# Patient Record
Sex: Male | Born: 1950 | Race: White | Hispanic: No | Marital: Married | Smoking: Never smoker
Health system: Southern US, Community
[De-identification: ages and names within clinical notes are randomized; demographics above are authoritative.]

## PROBLEM LIST (undated history)

## (undated) DIAGNOSIS — K649 Unspecified hemorrhoids: Secondary | ICD-10-CM

## (undated) DIAGNOSIS — F329 Major depressive disorder, single episode, unspecified: Secondary | ICD-10-CM

## (undated) DIAGNOSIS — E785 Hyperlipidemia, unspecified: Secondary | ICD-10-CM

## (undated) DIAGNOSIS — F32A Depression, unspecified: Secondary | ICD-10-CM

## (undated) HISTORY — PX: HERNIA REPAIR: SHX51

---

## 1999-05-22 ENCOUNTER — Ambulatory Visit (HOSPITAL_BASED_OUTPATIENT_CLINIC_OR_DEPARTMENT_OTHER): Admission: RE | Admit: 1999-05-22 | Discharge: 1999-05-22 | Payer: Self-pay

## 2000-02-05 ENCOUNTER — Ambulatory Visit (HOSPITAL_COMMUNITY): Admission: RE | Admit: 2000-02-05 | Discharge: 2000-02-05 | Payer: Self-pay | Admitting: Gastroenterology

## 2012-09-16 ENCOUNTER — Ambulatory Visit (HOSPITAL_COMMUNITY): Payer: Self-pay | Admitting: Licensed Clinical Social Worker

## 2015-03-17 ENCOUNTER — Other Ambulatory Visit: Payer: Self-pay | Admitting: Gastroenterology

## 2015-03-17 DIAGNOSIS — R079 Chest pain, unspecified: Secondary | ICD-10-CM

## 2015-03-23 ENCOUNTER — Ambulatory Visit
Admission: RE | Admit: 2015-03-23 | Discharge: 2015-03-23 | Disposition: A | Discharge status: Home / Self Care | Payer: Medicare Other | Source: Ambulatory Visit | Attending: Gastroenterology | Admitting: Gastroenterology

## 2015-03-23 DIAGNOSIS — R079 Chest pain, unspecified: Secondary | ICD-10-CM

## 2017-02-16 ENCOUNTER — Emergency Department (HOSPITAL_COMMUNITY)
Admission: EM | Admit: 2017-02-16 | Discharge: 2017-02-16 | Disposition: A | Discharge status: Home / Self Care | Payer: Medicare Other | Attending: Emergency Medicine | Admitting: Emergency Medicine

## 2017-02-16 ENCOUNTER — Encounter (HOSPITAL_COMMUNITY): Payer: Self-pay | Admitting: Nurse Practitioner

## 2017-02-16 DIAGNOSIS — K648 Other hemorrhoids: Secondary | ICD-10-CM | POA: Insufficient documentation

## 2017-02-16 DIAGNOSIS — K645 Perianal venous thrombosis: Secondary | ICD-10-CM

## 2017-02-16 HISTORY — DX: Major depressive disorder, single episode, unspecified: F32.9

## 2017-02-16 HISTORY — DX: Depression, unspecified: F32.A

## 2017-02-16 HISTORY — DX: Hyperlipidemia, unspecified: E78.5

## 2017-02-16 HISTORY — DX: Unspecified hemorrhoids: K64.9

## 2017-02-16 MED ORDER — LIDOCAINE-EPINEPHRINE (PF) 2 %-1:200000 IJ SOLN
10.0000 mL | Freq: Once | INTRAMUSCULAR | Status: AC
  Administered 2017-02-16: 10 mL
  Filled 2017-02-16: qty 20

## 2017-02-16 MED ORDER — SENNOSIDES-DOCUSATE SODIUM 8.6-50 MG PO TABS: 1.0000 | ORAL_TABLET | Freq: Every evening | ORAL | 0 refills | Status: DC | PRN

## 2017-02-16 MED ORDER — OXYCODONE-ACETAMINOPHEN 5-325 MG PO TABS: 2.0000 | ORAL_TABLET | ORAL | 0 refills | Status: DC | PRN

## 2017-02-16 NOTE — ED Provider Notes (Signed)
Emergency Department Provider Note   I have reviewed the triage vital signs and the nursing notes.   HISTORY  Chief Complaint Hemorrhoids   HPI Joshua Garner is a 66 y.o. male with PMH of HLD and hemorrhoids presents to the ED for evaluation of painful external hemorrhoids.  The patient states he has had a long history of hemorrhoids and began having pain over the last week.  He went to his primary care physician yesterday and was referred to general surgery with diagnosis of large external hemorrhoid.  This morning he went to the emergency department where he states it was "lanced" but continued to have pain and return of swelling later in the afternoon.  Denies any fevers or chills.  He has been applying [employer]. No prior treatment by GI or General Surgery.  Patient states that he did take a needle and "open up" one of the areas on his own with no relief.    Past Medical History:  Diagnosis Date  . Depression   . Dyslipidemia   . Hemorrhoids     There are no active problems to display for this patient.   History reviewed. No pertinent surgical history.    Allergies Patient has no known allergies.  History reviewed. No pertinent family history.  Social History Social History   Tobacco Use  . Smoking status: Never Smoker  . Smokeless tobacco: Never Used  Substance Use Topics  . Alcohol use: No    Frequency: Never  . Drug use: No    Review of Systems  Constitutional: No fever/chills Eyes: No visual changes. ENT: No sore throat. Cardiovascular: Denies chest pain. Respiratory: Denies shortness of breath. Gastrointestinal: No abdominal pain.  No nausea, no vomiting.  No diarrhea.  No constipation. Positive rectal pain.  Genitourinary: Negative for dysuria. Musculoskeletal: Negative for back pain. Skin: Negative for rash. Neurological: Negative for headaches, focal weakness or numbness.  10-point ROS otherwise  negative.  ____________________________________________   PHYSICAL EXAM:  VITAL SIGNS: ED Triage Vitals  Enc Vitals Group     BP 02/16/17 1639 (!) 151/87     Pulse Rate 02/16/17 1639 95     Resp 02/16/17 1639 18     Temp 02/16/17 1639 98.4 F (36.9 C)     Temp Source 02/16/17 1639 Oral     SpO2 02/16/17 1639 98 %     Weight 02/16/17 1639 180 lb (81.6 kg)     Height 02/16/17 1639 6' (1.829 m)     Pain Score 02/16/17 1703 8    Constitutional: Alert and oriented. Well appearing and in no acute distress. Eyes: Conjunctivae are normal. Head: Atraumatic. Nose: No congestion/rhinnorhea. Mouth/Throat: Mucous membranes are moist. Neck: No stridor.  Cardiovascular: Normal rate, regular rhythm. Good peripheral circulation. Grossly normal heart sounds.   Respiratory: Normal respiratory effort.  No retractions. Lungs CTAB. Gastrointestinal: Soft and nontender. No distention. Large external, thrombosed hemorrhoid on the left with non-tender likely internal hemorrhoid on the right. Small incision at the inferior aspect of the left hemorrhoid. DRE performed lateral to the hemorrhoids confirming no rectal prolapse.  Musculoskeletal: No lower extremity tenderness nor edema. No gross deformities of extremities. Neurologic:  Normal speech and language. No gross focal neurologic deficits are appreciated.  Skin:  Skin is warm, dry and intact. No rash noted.  ____________________________________________   PROCEDURES  Procedure(s) performed:   Marland Kitchen.Marland Kitchen.Incision and Drainage Date/Time: 02/17/2017 12:06 AM Performed by: Maia PlanLong, Joshua G, MD Authorized by: Maia PlanLong, Joshua G, MD  Consent:    Consent obtained:  Verbal   Consent given by:  Patient   Risks discussed:  Bleeding, damage to other organs, incomplete drainage, infection and pain   Alternatives discussed:  No treatment Location:    Type:  External thrombosed hemorrhoid   Size:  3   Location:  Anogenital   Anogenital location:   Perirectal Pre-procedure details:    Skin preparation:  Betadine Anesthesia (see MAR for exact dosages):    Anesthesia method:  Local infiltration   Local anesthetic:  Lidocaine 2% WITH epi Procedure type:    Complexity:  Complex Procedure details:    Needle aspiration: no     Incision types:  Single straight   Incision depth:  Dermal   Scalpel blade:  11   Wound management:  Probed and deloculated   Drainage:  Bloody   Drainage amount:  Moderate   Wound treatment:  Wound left open   Packing materials:  None Post-procedure details:    Patient tolerance of procedure:  Tolerated well, no immediate complications   ____________________________________________   INITIAL IMPRESSION / ASSESSMENT AND PLAN / ED COURSE  Pertinent labs & imaging results that were available during my care of the patient were reviewed by me and considered in my medical decision making (see chart for details).  Patient presents emergency department for evaluation of painful, external, thrombosed hemorrhoid on exam.  He has a second hemorrhoid that has no tenderness to palpation.  I suspect that this is internal and will not I&D this.  Plan to extend the incision over the external hemorrhoid and express any clot. Plan for steroid cream and GI follow up.   Patient I&D performed as above. Patient had already stabbed with a needle in some areas. Although did not want to cause constipation the patient is following with general surgery in the next 48 hours I prescribed a small amount of Percocet. Patient already has steroid suppositories and will use that along with Miralax.   At this time, I do not feel there is any life-threatening condition present. I have reviewed and discussed all results (EKG, imaging, lab, urine as appropriate), exam findings with patient. I have reviewed nursing notes and appropriate previous records.  I feel the patient is safe to be discharged home without further emergent workup. Discussed  usual and customary return precautions. Patient and family (if present) verbalize understanding and are comfortable with this plan.  Patient will follow-up with their primary care provider. If they do not have a primary care provider, information for follow-up has been provided to them. All questions have been answered.  ____________________________________________  FINAL CLINICAL IMPRESSION(S) / ED DIAGNOSES  Final diagnoses:  Thrombosed external hemorrhoid     MEDICATIONS GIVEN DURING THIS VISIT:  Medications  lidocaine-EPINEPHrine (XYLOCAINE W/EPI) 2 %-1:200000 (PF) injection 10 mL (10 mLs Infiltration Given 02/16/17 1847)     NEW OUTPATIENT MEDICATIONS STARTED DURING THIS VISIT:  Percocet   Note:  This document was prepared using Dragon voice recognition software and may include unintentional dictation errors.  Alona BeneJoshua Long, MD Emergency Medicine    Long, Arlyss RepressJoshua G, MD 02/17/17 [phone]

## 2017-02-16 NOTE — ED Triage Notes (Signed)
Pt states that he has an external hemorrhoid that was evaluated this morning but has refilled with blood again. Reports hx of recurrent hemorrhoids..Marland Kitchen

## 2017-02-16 NOTE — Discharge Instructions (Signed)
You were seen in the ED today with hemorrhoids. I was able to open the hemorrhoid again but suspect that you will continue to have pain. We will treat with a short course of pain medication until you are able to see the surgeon on Monday. Continue your laxative medication and return with any fever, chills, or worsening pain.

## 2017-06-05 ENCOUNTER — Other Ambulatory Visit: Payer: Self-pay | Admitting: Urology

## 2017-06-05 DIAGNOSIS — R972 Elevated prostate specific antigen [PSA]: Secondary | ICD-10-CM

## 2017-06-26 ENCOUNTER — Encounter: Payer: Self-pay | Admitting: Cardiovascular Disease

## 2017-06-26 ENCOUNTER — Ambulatory Visit (INDEPENDENT_AMBULATORY_CARE_PROVIDER_SITE_OTHER): Payer: Medicare Other | Admitting: Cardiovascular Disease

## 2017-06-26 ENCOUNTER — Ambulatory Visit
Admission: RE | Admit: 2017-06-26 | Discharge: 2017-06-26 | Disposition: A | Discharge status: Home / Self Care | Payer: Medicare Other | Source: Ambulatory Visit | Attending: Cardiovascular Disease | Admitting: Cardiovascular Disease

## 2017-06-26 VITALS — BP 104/74 | HR 81 | Ht 72.0 in | Wt 181.8 lb

## 2017-06-26 DIAGNOSIS — Z8249 Family history of ischemic heart disease and other diseases of the circulatory system: Secondary | ICD-10-CM

## 2017-06-26 DIAGNOSIS — E782 Mixed hyperlipidemia: Secondary | ICD-10-CM

## 2017-06-26 MED ORDER — ASPIRIN EC 81 MG PO TBEC: 81.0000 mg | DELAYED_RELEASE_TABLET | Freq: Every day | ORAL | Status: AC

## 2017-06-26 NOTE — Patient Instructions (Addendum)
Medication Instructions:  Your physician has recommended you make the following change in your medication:   DECREASE Aspirin to 81 mg once daily    Labwork: Your physician recommends that you return for lab work in: 3 months a few days before your office visit with Dr. Elease HashimotoNahser.  You will need to FAST for this appointment - nothing to eat or drink after midnight the night before except water.    Testing/Procedures: Your physician recommends that you get a Coronary Calcium Score - this test will be done at our office and the cost will be $150   Follow-Up: Your physician recommends that you schedule a follow-up appointment in: 3 months with Dr. Elease HashimotoNahser   If you need a refill on your cardiac medications before your next appointment, please call your pharmacy.   Thank you for choosing [facility]! Eligha BridegroomMichelle Swinyer, RN [phone]

## 2017-06-26 NOTE — Progress Notes (Signed)
Cardiology Office Note:    Date:  06/26/2017   ID:  Joshua Kluveraul Richardson, DOB 1950/05/23, MRN [id number]  PCP:  Malka SoJobe, Daniel B., MD  Cardiologist:  Kristeen MissPhilip Sonnie Pawloski, MD   Referring MD: Malka SoJobe, Daniel B., MD   Problem List 1. Hyperlipidemia   Chief Complaint  Patient presents with  . Hyperlipidemia     June 26, 2017    Joshua Garner is a 67 y.o. male with a hx of hyperlipidemia.  I saw him many years ago at my previous practice-[facility].  Has a strong family hx of CAD His younger brother recently had an MI.  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 Has been on Simva and now is on Crestor  Recent labs ( 06/20/17)  Chol = 168 Trig = 73 HDL = 57 LDL = 96  Is eating a grain free diet.  Has not tried Zetia yet.  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    Past Medical History:  Diagnosis Date  . Depression   . Dyslipidemia   . Hemorrhoids     Past Surgical History:  Procedure Laterality Date  . HERNIA REPAIR      Current Medications: Current Meds  Medication Sig  . aspirin EC 81 MG tablet Take 81 mg by mouth 2 (two) times daily.  . Omega-3 Fatty Acids (FISH OIL) 1200 MG CAPS Take 1 capsule by mouth 2 (two) times daily.  . rosuvastatin (CRESTOR) 40 MG tablet Take 1 tablet by mouth daily.     Allergies:   Patient has no known allergies.   Social History   Socioeconomic History  . Marital status: Married    Spouse name: Not on file  . Number of children: Not on file  . Years of education: Not on file  . Highest education level: Not on file  Occupational History  . Not on file  Social Needs  . Financial resource strain: Not on file  . Food insecurity:    Worry: Not on file    Inability: Not on file  . Transportation needs:    Medical: Not on file    Non-medical: Not on file  Tobacco Use  . Smoking  status: Never Smoker  . Smokeless tobacco: Never Used  Substance and Sexual Activity  . Alcohol use: No    Frequency: Never  . Drug use: No  . Sexual activity: Not on file  Lifestyle  . Physical activity:    Days per week: Not on file    Minutes per session: Not on file  . Stress: Not on file  Relationships  . Social connections:    Talks on phone: Not on file    Gets together: Not on file    Attends religious service: Not on file    Active member of club or organization: Not on file    Attends meetings of clubs or organizations: Not on file    Relationship status: Not on file  Other Topics Concern  . Not on file  Social History Narrative  . Not on file     Family History: The patient's family history includes Heart attack in his father and mother.  ROS:   Please see the history of present illness.     All other systems reviewed and are negative.  EKGs/Labs/Other Studies Reviewed:    The following studies were reviewed today:   EKG:  June 26, 2017:  NSR at 81, normal ECG   Recent Labs: No results found for requested labs within last 8760 hours.  Recent Lipid Panel No results found for: CHOL, TRIG, HDL, CHOLHDL, VLDL, LDLCALC, LDLDIRECT  Physical Exam:    VS:  BP 104/74   Pulse 81   Ht 6' (1.829 m)   Wt 181 lb 12.8 oz (82.5 kg)   SpO2 97%   BMI 24.66 kg/m     Wt Readings from Last 3 Encounters:  06/26/17 181 lb 12.8 oz (82.5 kg)  02/16/17 180 lb (81.6 kg)     GEN:  Well nourished, middle-aged gentleman.  No acute distress. HEENT: Normal NECK: No JVD; No carotid bruits LYMPHATICS: No lymphadenopathy CARDIAC:  RR , no murmur  RESPIRATORY:  Clear to auscultation without rales, wheezing or rhonchi  ABDOMEN: Soft, non-tender, non-distended MUSCULOSKELETAL:  No edema; No deformity  SKIN: Warm and dry NEUROLOGIC:  Alert and oriented x 3 PSYCHIATRIC:  Normal affect   ASSESSMENT:    No diagnosis found. PLAN:    In order of problems listed  above:  1. Hyperlipidemia: Joshua Garner is seen after an absence of many years.  I saw him in the past with a history of hyperlipidemia. His brother who is 4 years younger than he is recently had a major heart attack.  This got Joshua Garner a little bit more worried than usual.  He is been on Crestor 40 mg a day and despite high-dose Crestor his LDL is still 96.  He wonders if he should be more aggressive with this.  We will get a coronary calcium score for further assessment.  If the coronary calcium score is 0 then I think we would have the choice of continuing Crestor 40 mg a day or perhaps adding Zetia 10 mg a day.  If the coronary calcium score is elevated, we would add a minimum add Zetia to his medications but may even consider 1 of the injectable PCSK-9 inhibitors  We may also consider a Myoview study if the coronary calcium score is very high.  At this point he remains fairly asymptomatic.   Medication Adjustments/Labs and Tests Ordered: Current medicines are reviewed at length with the patient today.  Concerns regarding medicines are outlined above.  No orders of the defined types were placed in this encounter.  No orders of the defined types were placed in this encounter.   Signed, Kristeen Miss, MD  06/26/2017 3:23 PM    [facility]

## 2017-06-27 ENCOUNTER — Telehealth: Payer: Self-pay | Admitting: Nurse Practitioner

## 2017-06-27 MED ORDER — EZETIMIBE 10 MG PO TABS: 10.0000 mg | ORAL_TABLET | Freq: Every day | ORAL | 3 refills | Status: DC

## 2017-06-27 NOTE — Telephone Encounter (Signed)
-----   Message from Vesta MixerPhilip J Nahser, MD sent at 06/27/2017 12:13 PM EDT ----- Coronary calcium score is 0 He seems to be at low risk from that standpoint ( very strong family hx of CAD )  Contin. Crestor 40 mg a day  Add Zetia 10 mg a day  Recheck lipids, liver, BMP in 3 months

## 2017-06-27 NOTE — Telephone Encounter (Signed)
Reviewed test results and plan of care with patient who verbalized understanding and agreement. He is aware of new Rx for Zetia being sent to his pharmacy and is scheduled for repeat lab work in July. I advised him to call back sooner with questions or concerns and he thanked me for the call.

## 2017-06-27 NOTE — Addendum Note (Signed)
Addended by: Jacqlyn KraussOBERTSON, Shawny Borkowski on: 06/27/2017 01:47 PM   Modules accepted: Orders

## 2017-07-26 ENCOUNTER — Ambulatory Visit
Admission: RE | Admit: 2017-07-26 | Discharge: 2017-07-26 | Disposition: A | Discharge status: Home / Self Care | Payer: Medicare Other | Source: Ambulatory Visit | Attending: Urology | Admitting: Urology

## 2017-07-26 DIAGNOSIS — R972 Elevated prostate specific antigen [PSA]: Secondary | ICD-10-CM

## 2017-07-26 MED ORDER — GADOBENATE DIMEGLUMINE 529 MG/ML IV SOLN
17.0000 mL | Freq: Once | INTRAVENOUS | Status: AC | PRN
  Administered 2017-07-26: 17 mL via INTRAVENOUS

## 2017-10-09 ENCOUNTER — Encounter (INDEPENDENT_AMBULATORY_CARE_PROVIDER_SITE_OTHER): Payer: Self-pay

## 2017-10-09 ENCOUNTER — Other Ambulatory Visit: Payer: Medicare Other | Admitting: *Deleted

## 2017-10-09 DIAGNOSIS — Z8249 Family history of ischemic heart disease and other diseases of the circulatory system: Secondary | ICD-10-CM

## 2017-10-09 DIAGNOSIS — E782 Mixed hyperlipidemia: Secondary | ICD-10-CM

## 2017-10-10 LAB — NMR LIPOPROF + GRAPH
CHOLESTEROL, TOTAL: 140 mg/dL (ref 100–199)
HDL Particle Number: 37 umol/L (ref >=30.5)
HDL-C: 59 mg/dL (ref >39)
LDL PARTICLE NUMBER: 635 nmol/L (ref <1000)
LDL SIZE: 20.2 nm — AB (ref >20.5)
LDL-C: 62 mg/dL (ref 0–99)
LP-IR SCORE: 25 (ref <=45)
Small LDL Particle Number: 284 nmol/L (ref <=527)
Triglycerides: 94 mg/dL (ref 0–149)

## 2017-10-10 LAB — BASIC METABOLIC PANEL
BUN/Creatinine Ratio: 13 (ref 10–24)
BUN: 16 mg/dL (ref 8–27)
CALCIUM: 10.2 mg/dL (ref 8.6–10.2)
CO2: 27 mmol/L (ref 20–29)
Chloride: 98 mmol/L (ref 96–106)
Creatinine, Ser: 1.21 mg/dL (ref 0.76–1.27)
GFR calc Af Amer: 71 mL/min/{1.73_m2} (ref >59)
GFR calc non Af Amer: 62 mL/min/{1.73_m2} (ref >59)
GLUCOSE: 92 mg/dL (ref 65–99)
POTASSIUM: 4.5 mmol/L (ref 3.5–5.2)
SODIUM: 140 mmol/L (ref 134–144)

## 2017-10-10 LAB — HEPATIC FUNCTION PANEL
ALT: 34 IU/L (ref 0–44)
AST: 36 IU/L (ref 0–40)
Albumin: 4.9 g/dL — ABNORMAL HIGH (ref 3.6–4.8)
Alkaline Phosphatase: 49 IU/L (ref 39–117)
BILIRUBIN TOTAL: 1 mg/dL (ref 0.0–1.2)
Bilirubin, Direct: 0.26 mg/dL (ref 0.00–0.40)
TOTAL PROTEIN: 6.9 g/dL (ref 6.0–8.5)

## 2017-10-10 LAB — LIPOPROTEIN A (LPA): LIPOPROTEIN (A): 39.3 nmol/L (ref <75.0)

## 2017-10-10 LAB — APOLIPOPROTEIN B: Apolipoprotein B: 66 mg/dL (ref <90)

## 2017-10-11 ENCOUNTER — Ambulatory Visit (INDEPENDENT_AMBULATORY_CARE_PROVIDER_SITE_OTHER): Payer: Medicare Other | Admitting: Cardiovascular Disease

## 2017-10-11 ENCOUNTER — Encounter: Payer: Self-pay | Admitting: Cardiovascular Disease

## 2017-10-11 DIAGNOSIS — E782 Mixed hyperlipidemia: Secondary | ICD-10-CM | POA: Diagnosis not present

## 2017-10-11 DIAGNOSIS — E785 Hyperlipidemia, unspecified: Secondary | ICD-10-CM | POA: Insufficient documentation

## 2017-10-11 NOTE — Patient Instructions (Signed)
Medication Instructions:  Your physician recommends that you continue on your current medications as directed. Please refer to the Current Medication list given to you today.   Labwork: None Ordered   Testing/Procedures: None Ordered   Follow-Up: Your physician recommends that you schedule a follow-up appointment in: as needed with Dr. Nahser   If you need a refill on your cardiac medications before your next appointment, please call your pharmacy.   Thank you for choosing [facility]! Aarini Slee, RN [phone]    

## 2017-10-11 NOTE — Progress Notes (Signed)
Cardiology Office Note:    Date:  10/11/2017   ID:  Joshua Garner, DOB [birth year], MRN [id number]  PCP:  Malka SoJobe, Daniel B., MD  Cardiologist:  Kristeen MissPhilip Nahser, MD   Referring MD: Malka SoJobe, Daniel B., MD   Problem List 1. Hyperlipidemia   Chief Complaint  Patient presents with  . Hyperlipidemia     June 26, 2017    Joshua Garner is a 67 y.o. male with a hx of hyperlipidemia.  I saw him many years ago at my previous practice-[facility].  Has a strong family hx of CAD His younger brother recently had an MI.  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 Has been on Simva and now is on Crestor  Recent labs ( 06/20/17)  Chol = 168 Trig = 73 HDL = 57 LDL = 96  Is eating a grain free diet.  Has not tried Zetia yet.  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   October 11, 2017:  Joshua Garner is seen back today for a history of hyperlipidemia.  He has a family history of coronary artery disease. CT scan reveals a coronary calcium score of 0. He is been on Crestor 4 mg a day.  We added Zetia 10 mg a day. Labs from yesterday revealed an LDL particle number of 635. His LP (a) , ApoB are all normal.     Past Medical History:  Diagnosis Date  . Depression   . Dyslipidemia   . Hemorrhoids     Past Surgical History:  Procedure Laterality Date  . HERNIA REPAIR      Current Medications: Current Meds  Medication Sig  . aspirin EC 81 MG tablet Take 1 tablet (81 mg total) by mouth daily.  Marland Kitchen. buPROPion (WELLBUTRIN XL) 150 MG 24 hr tablet TAKE 1 TABLET BY MOUTH EVERY DAY IN THE MORNING  . cetirizine (ZYRTEC) 10 MG tablet Take 1 tablet by mouth daily.  Marland Kitchen. ezetimibe (ZETIA) 10 MG tablet Take 1 tablet (10 mg total) by mouth daily.  . Multiple Vitamins-Minerals (MULTIVITAMIN ADULT EXTRA C PO) Take 1 tablet by mouth daily.  . Omega-3 Fatty Acids  (FISH OIL) 1200 MG CAPS Take 1 capsule by mouth 2 (two) times daily.  . rosuvastatin (CRESTOR) 40 MG tablet Take 1 tablet by mouth daily.     Allergies:   Patient has no known allergies.   Social History   Socioeconomic History  . Marital status: Married    Spouse name: Not on file  . Number of children: Not on file  . Years of education: Not on file  . Highest education level: Not on file  Occupational History  . Not on file  Social Needs  . Financial resource strain: Not on file  . Food insecurity:    Worry: Not on file    Inability: Not on file  . Transportation needs:    Medical: Not on file    Non-medical: Not on file  Tobacco Use  . Smoking status: Never Smoker  . Smokeless tobacco: Never Used  Substance and Sexual Activity  . Alcohol use: No    Frequency: Never  . Drug use: No  . Sexual activity: Not on file  Lifestyle  . Physical activity:    Days per week: Not on file  Minutes per session: Not on file  . Stress: Not on file  Relationships  . Social connections:    Talks on phone: Not on file    Gets together: Not on file    Attends religious service: Not on file    Active member of club or organization: Not on file    Attends meetings of clubs or organizations: Not on file    Relationship status: Not on file  Other Topics Concern  . Not on file  Social History Narrative  . Not on file     Family History: The patient's family history includes Heart attack in his father and mother.  ROS:   Please see the history of present illness.     All other systems reviewed and are negative.  EKGs/Labs/Other Studies Reviewed:    The following studies were reviewed today:   EKG:  June 26, 2017:  NSR at 81, normal ECG   Recent Labs: 10/09/2017: ALT 34; BUN 16; Creatinine, Ser 1.21; Potassium 4.5; Sodium 140  Recent Lipid Panel No results found for: CHOL, TRIG, HDL, CHOLHDL, VLDL, LDLCALC, LDLDIRECT  Physical Exam:    Physical Exam: Blood pressure  126/76, pulse 71, height 6' (1.829 m), weight 184 lb 12.8 oz (83.8 kg), SpO2 99 %.  GEN:  Well nourished, well developed in no acute distress HEENT: Normal NECK: No JVD; No carotid bruits LYMPHATICS: No lymphadenopathy CARDIAC: RR  RESPIRATORY:  Clear to auscultation without rales, wheezing or rhonchi  ABDOMEN: Soft, non-tender, non-distended MUSCULOSKELETAL:  No edema; No deformity  SKIN: Warm and dry NEUROLOGIC:  Alert and oriented x 3   ASSESSMENT:    No diagnosis found. PLAN:    In order of problems listed above:  1. Hyperlipidemia: Deloy is seen after an absence of many years.  I saw him in the past with a history of hyperlipidemia. His brother who is 4 years younger than he is recently had a major heart attack.  This got Daren a little bit more worried than usual.  He is been on Crestor 40 mg a day and despite high-dose Crestor his LDL is still 96.    [name] had a coronary calcium score which was 0.  This places him at very low risk.  We have added Zetia to his medical regimen.  With that, his LDL particle number is 635.  Total cholesterol is 140.  HDL is 59.  Triglyceride level is 94.  Overall his lipid levels look great.  I have encouraged him to check in with his medical doctor.  If his medical doctor is very comfortable managing his lipids then he can see me on an as-needed basis.  I will be happy to see him once a year if we need.   Medication Adjustments/Labs and Tests Ordered: Current medicines are reviewed at length with the patient today.  Concerns regarding medicines are outlined above.  No orders of the defined types were placed in this encounter.  No orders of the defined types were placed in this encounter.   Signed, Kristeen Miss, MD  10/11/2017 5:08 PM    [facility]

## 2018-06-16 ENCOUNTER — Other Ambulatory Visit: Payer: Self-pay | Admitting: Cardiovascular Disease

## 2018-09-04 IMAGING — CT CT HEART SCORING
2 series · 16 of 20 positions shown, 18 images · non-contrast
Comparison: None.

CLINICAL DATA: Risk stratification

EXAM:
Coronary Calcium Score
TECHNIQUE: The patient was scanned on a Siemens Force scanner. Axial
non-contrast 3 mm slices were carried out through the heart. The
data set was analyzed on a dedicated work station and scored using
the Agatson method.

[Series 2: casc 3.0 i36f 2 bestdiast 71 % · axial · 0.36mm/px · z∈[-294,-174]mm · 8 of 52 slices shown, 10 images]
[im 6/52  vessel]
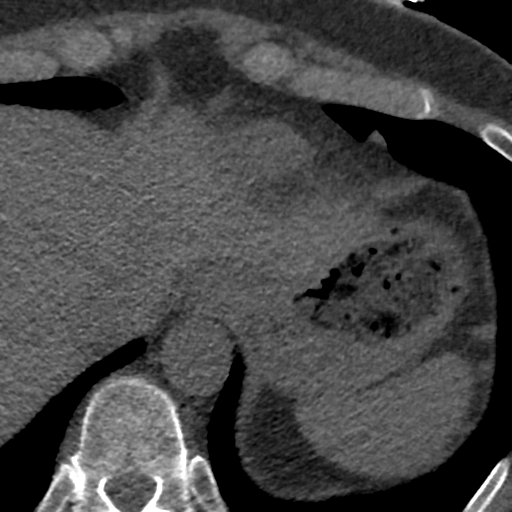
[im 6/52  lung]
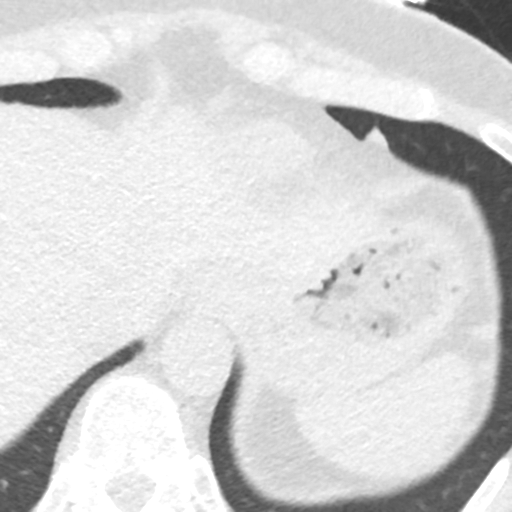
[im 12/52  vessel]
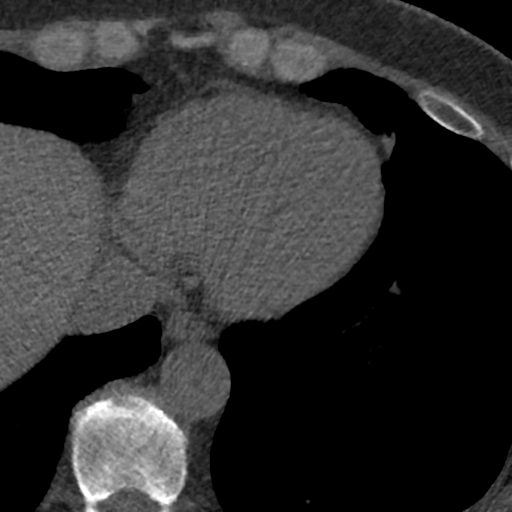
[im 18/52  vessel]
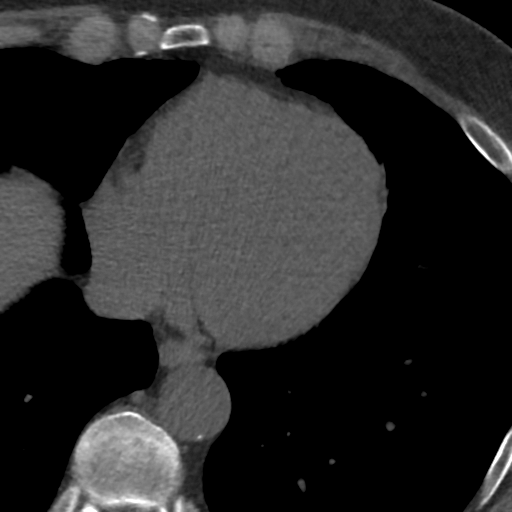
[im 23/52  vessel]
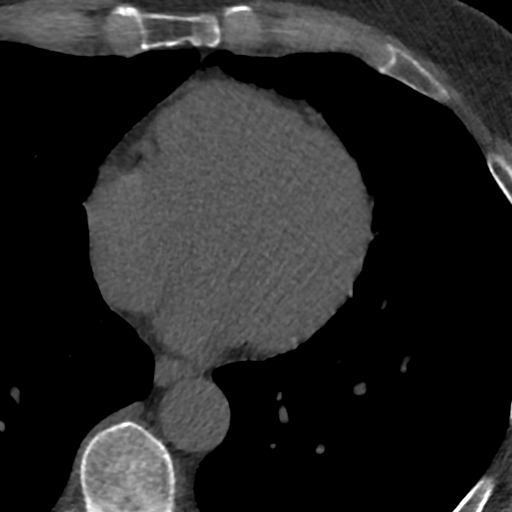
[im 29/52  vessel]
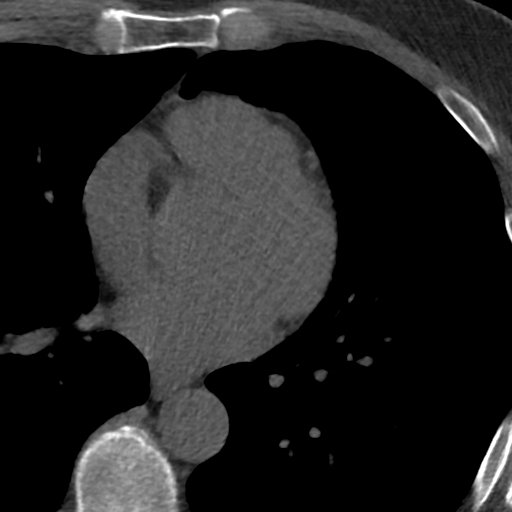
[im 29/52  lung]
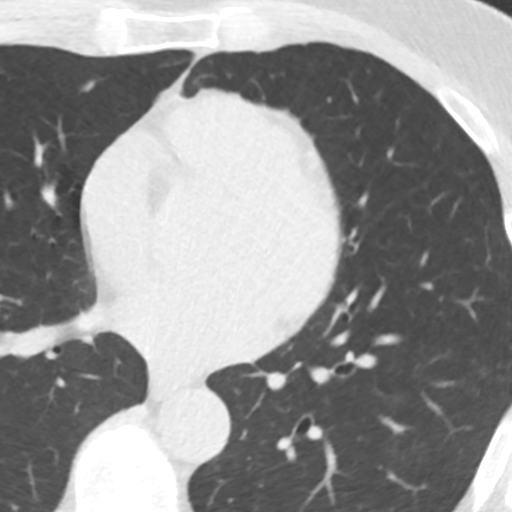
[im 35/52  vessel]
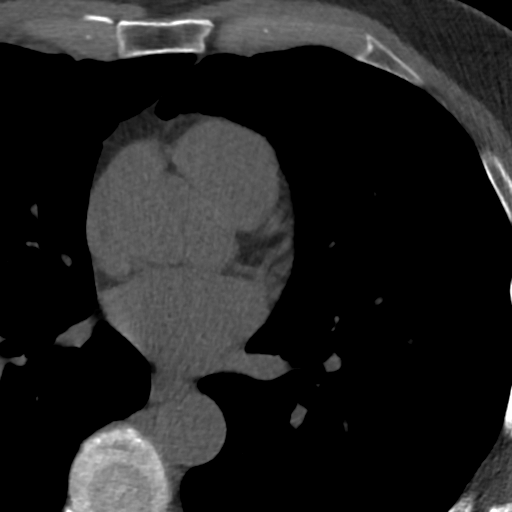
[im 40/52  vessel]
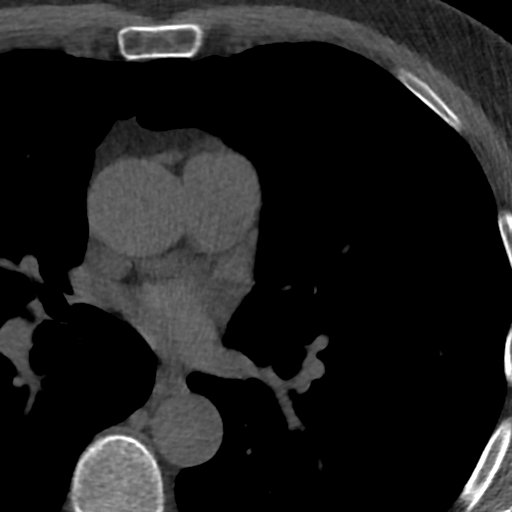
[im 46/52  vessel]
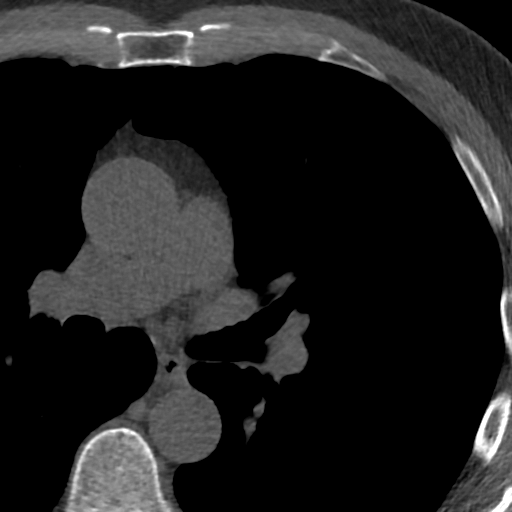

[Series 4: lung st 70 % · axial · 0.70mm/px · z∈[-294,-174]mm · 8 of 52 slices shown]
[im 6/52  lung]
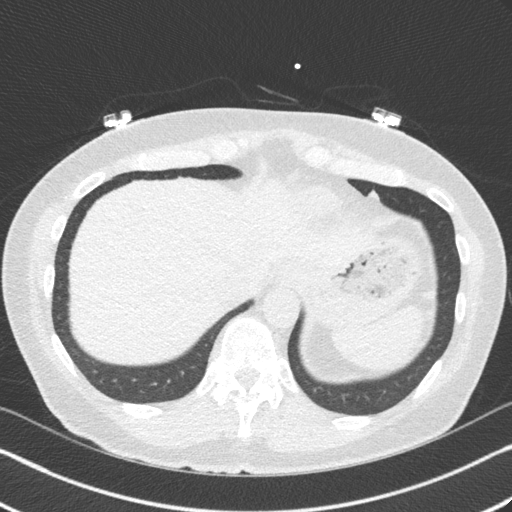
[im 12/52  lung]
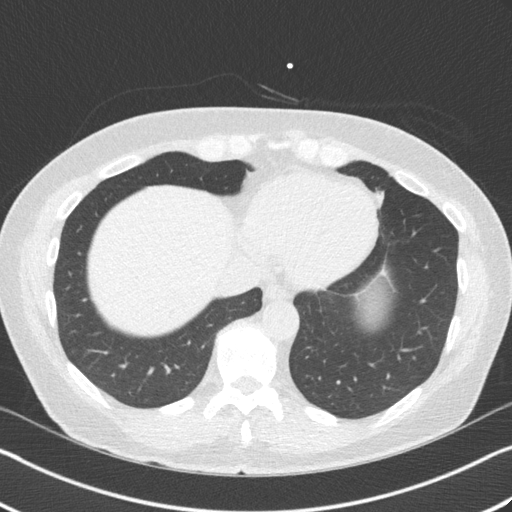
[im 18/52  lung]
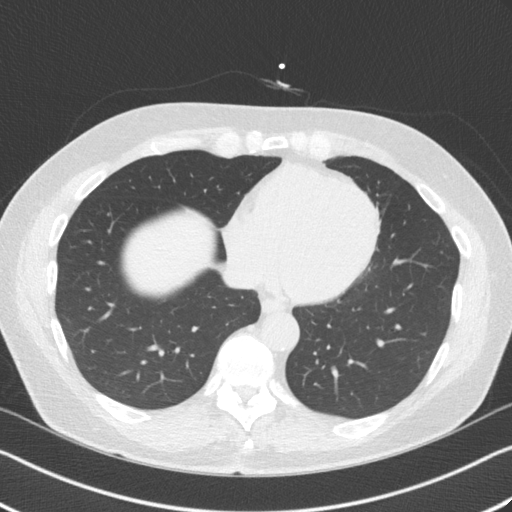
[im 23/52  lung]
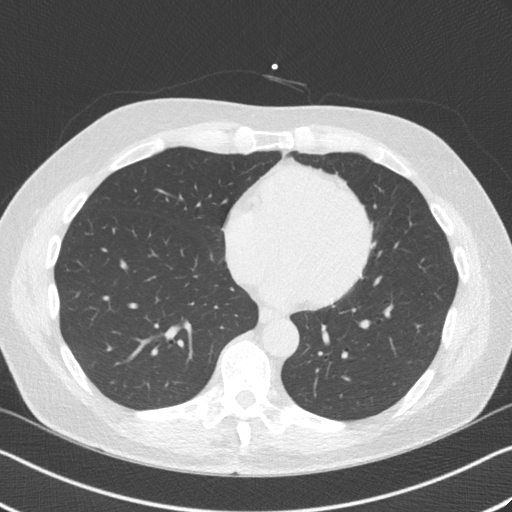
[im 29/52  lung]
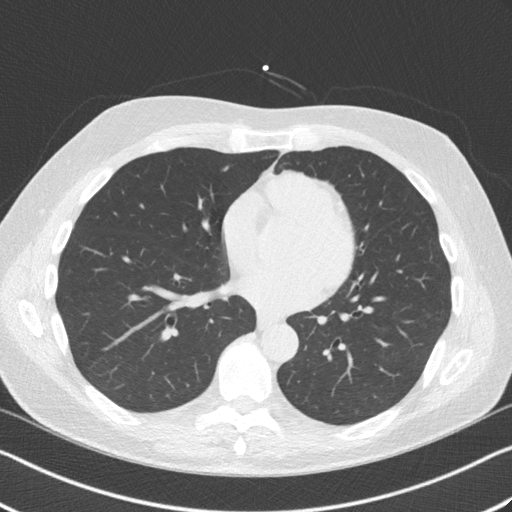
[im 35/52  lung]
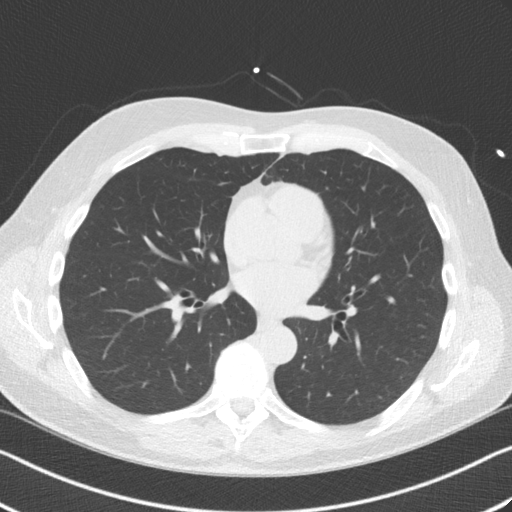
[im 40/52  lung]
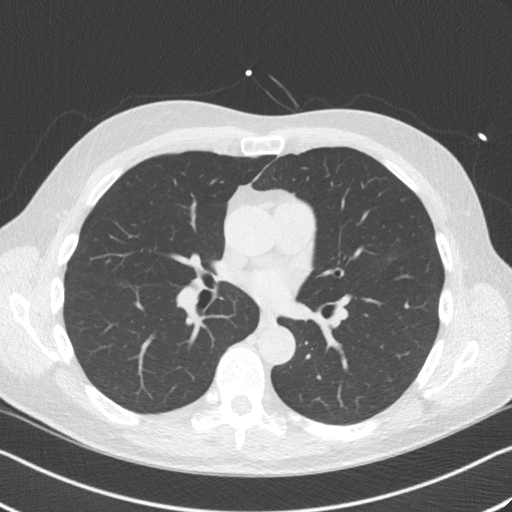
[im 46/52  lung]
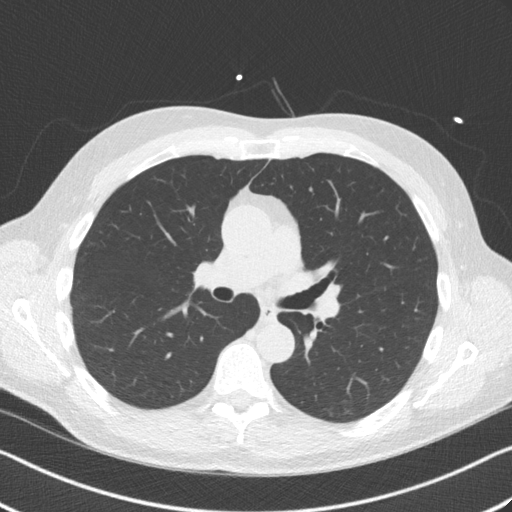

[16 of 20 positions shown; findings below may reference images not displayed]

FINDINGS: Non-cardiac: See separate report from [REDACTED].

Ascending Aorta: Normal size, no calcifications.

Pericardium: Normal.

Coronary arteries: Normal origin.
IMPRESSION: Coronary calcium score of 0. This was 0 percentile for age and sex
matched control.

EXAM:
OVER-READ INTERPRETATION  CT CHEST

The following report is an over-read performed by radiologist Dr.
over-read does not include interpretation of cardiac or coronary
anatomy or pathology. The coronary calcium score/coronary CTA
interpretation by the cardiologist is attached.
FINDINGS: Vascular: The visualized aorta and central pulmonary arteries are
normal in caliber.

Mediastinum/Nodes: No lymphadenopathy or mediastinal masses
identified.

Lungs/Pleura: There is no evidence of pulmonary edema,
consolidation, pneumothorax, nodule or pleural fluid.

Upper Abdomen: No acute abnormality.

Musculoskeletal: No chest wall mass or suspicious bone lesions
identified.
IMPRESSION: No significant noncardiac findings in the visualized portion of the
chest.

## 2018-09-11 ENCOUNTER — Other Ambulatory Visit: Payer: Self-pay | Admitting: Cardiovascular Disease

## 2018-09-25 ENCOUNTER — Telehealth: Payer: Self-pay | Admitting: Cardiovascular Disease

## 2018-09-25 DIAGNOSIS — Z8249 Family history of ischemic heart disease and other diseases of the circulatory system: Secondary | ICD-10-CM

## 2018-09-25 DIAGNOSIS — E782 Mixed hyperlipidemia: Secondary | ICD-10-CM

## 2018-09-25 NOTE — Telephone Encounter (Signed)
Patient has follow up appt in September and would like to have labs done prior to his visit. He would like for lab orders to be placed so he can get them done.

## 2018-09-25 NOTE — Telephone Encounter (Signed)
09/2017 you did Lipoprotein A, Alipoprotein B, NMR profile, LFTs and BMET.  What labs would you like to do this year?

## 2018-09-25 NOTE — Telephone Encounter (Signed)
His lipomed profile looked great last year. Just need basic lipid profile,  Lipids, liver enz

## 2018-09-29 NOTE — Telephone Encounter (Signed)
Spoke with patient and scheduled fasting lab appointment for 9/9. Advised patient to call back to reschedule if needed, as long as lab appointment is more than 1 day prior to office visit. Patient verbalized understanding and agreement thanked me for the call

## 2018-12-03 ENCOUNTER — Other Ambulatory Visit: Payer: Medicare Other

## 2018-12-04 ENCOUNTER — Other Ambulatory Visit: Payer: Self-pay

## 2018-12-04 ENCOUNTER — Encounter (INDEPENDENT_AMBULATORY_CARE_PROVIDER_SITE_OTHER): Payer: Self-pay

## 2018-12-04 ENCOUNTER — Other Ambulatory Visit: Payer: Medicare Other | Admitting: *Deleted

## 2018-12-04 DIAGNOSIS — E782 Mixed hyperlipidemia: Secondary | ICD-10-CM

## 2018-12-04 DIAGNOSIS — Z8249 Family history of ischemic heart disease and other diseases of the circulatory system: Secondary | ICD-10-CM

## 2018-12-04 LAB — HEPATIC FUNCTION PANEL
ALT: 35 IU/L (ref 0–44)
AST: 37 IU/L (ref 0–40)
Albumin: 4.6 g/dL (ref 3.8–4.8)
Alkaline Phosphatase: 53 IU/L (ref 39–117)
Bilirubin Total: 0.5 mg/dL (ref 0.0–1.2)
Bilirubin, Direct: 0.18 mg/dL (ref 0.00–0.40)
Total Protein: 6.6 g/dL (ref 6.0–8.5)

## 2018-12-04 LAB — LIPID PANEL
Chol/HDL Ratio: 1.9 ratio (ref 0.0–5.0)
Cholesterol, Total: 118 mg/dL (ref 100–199)
HDL: 62 mg/dL (ref >39)
LDL Chol Calc (NIH): 45 mg/dL (ref 0–99)
Triglycerides: 45 mg/dL (ref 0–149)
VLDL Cholesterol Cal: 11 mg/dL (ref 5–40)

## 2018-12-04 LAB — BASIC METABOLIC PANEL
BUN/Creatinine Ratio: 12 (ref 10–24)
BUN: 13 mg/dL (ref 8–27)
CO2: 25 mmol/L (ref 20–29)
Calcium: 9.7 mg/dL (ref 8.6–10.2)
Chloride: 100 mmol/L (ref 96–106)
Creatinine, Ser: 1.06 mg/dL (ref 0.76–1.27)
GFR calc Af Amer: 82 mL/min/{1.73_m2} (ref >59)
GFR calc non Af Amer: 71 mL/min/{1.73_m2} (ref >59)
Glucose: 114 mg/dL — ABNORMAL HIGH (ref 65–99)
Potassium: 4.8 mmol/L (ref 3.5–5.2)
Sodium: 141 mmol/L (ref 134–144)

## 2018-12-09 ENCOUNTER — Other Ambulatory Visit: Payer: Self-pay

## 2018-12-09 ENCOUNTER — Encounter: Payer: Self-pay | Admitting: Cardiovascular Disease

## 2018-12-09 ENCOUNTER — Ambulatory Visit (INDEPENDENT_AMBULATORY_CARE_PROVIDER_SITE_OTHER): Payer: Medicare Other | Admitting: Cardiovascular Disease

## 2018-12-09 VITALS — BP 124/80 | HR 67 | Ht 71.0 in | Wt 189.8 lb

## 2018-12-09 DIAGNOSIS — E782 Mixed hyperlipidemia: Secondary | ICD-10-CM | POA: Diagnosis not present

## 2018-12-09 MED ORDER — EZETIMIBE 10 MG PO TABS: 10.0000 mg | ORAL_TABLET | Freq: Every day | ORAL | 3 refills | Status: DC

## 2018-12-09 NOTE — Progress Notes (Signed)
Cardiology Office Note:    Date:  12/09/2018   ID:  Joshua Garner, DOB Nov 13, 1950, MRN [id number]  PCP:  Malka So., MD  Cardiologist:  Kristeen Miss, MD   Referring MD: Malka So., MD   Problem List 1. Hyperlipidemia   Chief Complaint  Patient presents with  . Hyperlipidemia     June 26, 2017    Joshua Garner is a 68 y.o. male with a hx of hyperlipidemia.  I saw him many years ago at my previous practice-[facility].  Has a strong family hx of CAD His younger brother recently had an MI.  Father had MI at age 103,  Father had a CVA at 73 - died  Mother had a massive at age 51  I saw Ramondo many years ago for atypical CP and hyperlipidemia. Has been on Simva and now is on Crestor  Recent labs ( 06/20/17)  Chol = 168 Trig = 73 HDL = 57 LDL = 96  Is eating a grain free diet.  Has not tried Zetia yet.  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   October 11, 2017:  [name] is seen back today for a history of hyperlipidemia.  He has a family history of coronary artery disease. CT scan reveals a coronary calcium score of 0. He is been on Crestor 4 mg a day.  We added Zetia 10 mg a day. Labs from yesterday revealed an LDL particle number of 635. His LP (a) , ApoB are all normal.     Sept. 15, 2020  Joshua Garner is seen  In July  2019 after an absence of many years.  I saw him in the past with a history of hyperlipidemia. His brother who is 4 years younger than he is recently had a major heart attack.  This got Earline a little bit more worried than usual.  He is been on Crestor 40 mg a day and despite high-dose Crestor his LDL is still 96.    Joshua Garner had a coronary calcium score which was 0.  This places him at very low risk.  We have added Zetia to his medical regimen.  With that, his LDL particle number is 635.  Total cholesterol is 140.  HDL is 59.  Triglyceride level is 94.  On Zetia and crestor 40 mg a day  Lipids levels look great.     Past Medical History:  Diagnosis Date  . Depression   . Dyslipidemia   . Hemorrhoids     Past Surgical History:  Procedure Laterality Date  . HERNIA REPAIR      Current Medications: Current Meds  Medication Sig  . aspirin EC 81 MG tablet Take 1 tablet (81 mg total) by mouth daily.  Marland Kitchen buPROPion (WELLBUTRIN XL) 150 MG 24 hr tablet TAKE 1 TABLET BY MOUTH EVERY DAY IN THE MORNING  . cetirizine (ZYRTEC) 10 MG tablet Take 1 tablet by mouth daily.  Marland Kitchen ezetimibe (ZETIA) 10 MG tablet Take 1 tablet (10 mg total) by mouth daily.  . Multiple Vitamins-Minerals (MULTIVITAMIN ADULT EXTRA C PO) Take 1 tablet by mouth daily.  . Omega-3 Fatty Acids (FISH OIL) 1200 MG CAPS Take 1 capsule by mouth 2 (two) times daily.  . rosuvastatin (CRESTOR) 40 MG tablet Take 1 tablet by mouth daily.  . [DISCONTINUED] ezetimibe (ZETIA) 10 MG tablet TAKE 1 TABLET BY MOUTH EVERY DAY     Allergies:   Patient has no known allergies.  Social History   Socioeconomic History  . Marital status: Married    Spouse name: Not on file  . Number of children: Not on file  . Years of education: Not on file  . Highest education level: Not on file  Occupational History  . Not on file  Social Needs  . Financial resource strain: Not on file  . Food insecurity    Worry: Not on file    Inability: Not on file  . Transportation needs    Medical: Not on file    Non-medical: Not on file  Tobacco Use  . Smoking status: Never Smoker  . Smokeless tobacco: Never Used  Substance and Sexual Activity  . Alcohol use: No    Frequency: Never  . Drug use: No  . Sexual activity: Not on file  Lifestyle  . Physical activity    Days per week: Not on file    Minutes per session: Not on file  . Stress: Not on file  Relationships  . Social Herbalist on phone: Not on file    Gets together: Not on file    Attends religious service: Not on file    Active member of club or organization: Not on file    Attends meetings  of clubs or organizations: Not on file    Relationship status: Not on file  Other Topics Concern  . Not on file  Social History Narrative  . Not on file     Family History: The patient's family history includes Heart attack in his father and mother.  ROS:   Please see the history of present illness.     All other systems reviewed and are negative.  EKGs/Labs/Other Studies Reviewed:    The following studies were reviewed today:  EKG:   Sept. 15, 2020:   NSR at 28.   Normal ecg   Recent Labs: 12/04/2018: ALT 35; BUN 13; Creatinine, Ser 1.06; Potassium 4.8; Sodium 141  Recent Lipid Panel    Component Value Date/Time   CHOL 118 12/04/2018 0742   TRIG 45 12/04/2018 0742   HDL 62 12/04/2018 0742   CHOLHDL 1.9 12/04/2018 0742    Physical Exam:    Physical Exam: Blood pressure 124/80, pulse 67, height 5\' 11"  (1.803 m), weight 189 lb 12.8 oz (86.1 kg), SpO2 98 %.  GEN:  Well nourished, well developed in no acute distress HEENT: Normal NECK: No JVD; No carotid bruits LYMPHATICS: No lymphadenopathy CARDIAC: RR  RESPIRATORY:  Clear to auscultation without rales, wheezing or rhonchi  ABDOMEN: Soft, non-tender, non-distended MUSCULOSKELETAL:  No edema; No deformity  SKIN: Warm and dry NEUROLOGIC:  Alert and oriented x 3   ASSESSMENT:    1. Mixed hyperlipidemia    PLAN:    In order of problems listed above:  Hyperlipidemia:   Labs look great.  Continue current meds.  Coronary calcium score was 0.  He has a family hx of CAD ( brother had an MI and was younger than he is )    Medication Adjustments/Labs and Tests Ordered: Current medicines are reviewed at length with the patient today.  Concerns regarding medicines are outlined above.  Orders Placed This Encounter  Procedures  . EKG 12-Lead   Meds ordered this encounter  Medications  . ezetimibe (ZETIA) 10 MG tablet    Sig: Take 1 tablet (10 mg total) by mouth daily.    Dispense:  90 tablet    Refill:  3  Signed, Kristeen MissPhilip Bertha Lokken, MD  12/09/2018 4:45 PM    [facility]

## 2018-12-09 NOTE — Patient Instructions (Signed)
Medication Instructions:  Your physician recommends that you continue on your current medications as directed. Please refer to the Current Medication list given to you today.  If you need a refill on your cardiac medications before your next appointment, please call your pharmacy.   Lab work: None If you have labs (blood work) drawn today and your tests are completely normal, you will receive your results only by: . MyChart Message (if you have MyChart) OR . A paper copy in the mail If you have any lab test that is abnormal or we need to change your treatment, we will call you to review the results.  Testing/Procedures: None  Follow-Up: At [facility], you and your health needs are our priority.  As part of our continuing mission to provide you with exceptional heart care, we have created designated Provider Care Teams.  These Care Teams include your primary Cardiologist (physician) and Advanced Practice Providers (APPs -  Physician Assistants and Nurse Practitioners) who all work together to provide you with the care you need, when you need it. You will need a follow up appointment in:  12 months.  Please call our office 2 months in advance to schedule this appointment.  You may see Philip Nahser, MD or one of the following Advanced Practice Providers on your designated Care Team: Scott Weaver, PA-C Vin Bhagat, PA-C . Janine Hammond, NP  Any Other Special Instructions Will Be Listed Below (If Applicable).    

## 2019-05-10 ENCOUNTER — Ambulatory Visit: Payer: Medicare Other | Attending: Internal Medicine

## 2019-05-10 DIAGNOSIS — Z23 Encounter for immunization: Secondary | ICD-10-CM

## 2019-05-10 NOTE — Progress Notes (Signed)
   Covid-19 Vaccination Clinic  Name:  Joshua Garner    MRN: [id number] DOB: [birth year]  05/10/2019  Joshua Garner was observed post Covid-19 immunization for 15 minutes without incidence. He was provided with Vaccine Information Sheet and instruction to access the V-Safe system.   Joshua Garner was instructed to call 911 with any severe reactions post vaccine: Marland Kitchen Difficulty breathing  . Swelling of your face and throat  . A fast heartbeat  . A bad rash all over your body  . Dizziness and weakness    Immunizations Administered    Name Date Dose VIS Date Route   Pfizer COVID-19 Vaccine 05/10/2019  2:05 PM 0.3 mL 03/06/2019 Intramuscular   Manufacturer: [employer], [employer]   Lot: [id number]   NDC: [address]-7

## 2019-06-02 ENCOUNTER — Ambulatory Visit: Payer: Medicare Other | Attending: Internal Medicine

## 2019-06-02 DIAGNOSIS — Z23 Encounter for immunization: Secondary | ICD-10-CM

## 2019-06-02 NOTE — Progress Notes (Signed)
   Covid-19 Vaccination Clinic  Name:  Joshua Garner    MRN: [id number] DOB: [birth year]  06/02/2019  Mr. Stines was observed post Covid-19 immunization for 15 minutes without incident. He was provided with Vaccine Information Sheet and instruction to access the V-Safe system.   Mr. Alcocer was instructed to call 911 with any severe reactions post vaccine: Marland Kitchen Difficulty breathing  . Swelling of face and throat  . A fast heartbeat  . A bad rash all over body  . Dizziness and weakness   Immunizations Administered    Name Date Dose VIS Date Route   Pfizer COVID-19 Vaccine 06/02/2019  3:59 PM 0.3 mL 03/06/2019 Intramuscular   Manufacturer: [employer], [employer]   Lot: [id number]   NDC: [address]-3

## 2019-06-03 ENCOUNTER — Ambulatory Visit: Payer: Medicare Other

## 2019-12-11 ENCOUNTER — Other Ambulatory Visit: Payer: Self-pay | Admitting: Cardiovascular Disease

## 2019-12-27 ENCOUNTER — Encounter: Payer: Self-pay | Admitting: Cardiovascular Disease

## 2019-12-27 NOTE — Progress Notes (Signed)
Cardiology Office Note:    Date:  12/28/2019   ID:  Joshua Garner, DOB April 10, 1950, MRN [id number]  PCP:  Malka So., MD  Cardiologist:  Kristeen Miss, MD   Referring MD: Malka So., MD   Problem List 1. Hyperlipidemia   Chief Complaint  Patient presents with   Hyperlipidemia     June 26, 2017    Joshua Garner is a 69 y.o. male with a hx of hyperlipidemia.  I saw him many years ago at my previous practice-[facility].  Has a strong family hx of CAD His younger brother recently had an MI.  Father had MI at age 57,  Father had a CVA at 19 - died  Mother had a massive at age 87  I saw Joshua Garner many years ago for atypical CP and hyperlipidemia. Has been on Simva and now is on Crestor  Recent labs ( 06/20/17)  Chol = 168 Trig = 73 HDL = 57 LDL = 96  Is eating a grain free diet.  Has not tried Zetia yet.  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   October 11, 2017:  Don is seen back today for a history of hyperlipidemia.  He has a family history of coronary artery disease. CT scan reveals a coronary calcium score of 0. He is been on Crestor 4 mg a day.  We added Zetia 10 mg a day. Labs from yesterday revealed an LDL particle number of 635. His LP (a) , ApoB are all normal.     Sept. 15, 2020  Joshua Garner is seen  In July  2019 after an absence of many years.  I saw him in the past with a history of hyperlipidemia. His brother who is 4 years younger than he is recently had a major heart attack.  This got Joshua Garner a little bit more worried than usual.  He is been on Crestor 40 mg a day and despite high-dose Crestor his LDL is still 96.    Joshua Garner had a coronary calcium score which was 0.  This places him at very low risk.  We have added Zetia to his medical regimen.  With that, his LDL particle number is 635.  Total cholesterol is 140.  HDL is 59.  Triglyceride level is 94.  On Zetia and crestor 40 mg a day  Lipids levels look great.     Oct. 4, 2021:  Joshua Garner is seen for follow up of his hyperlipidemia Coronary calcium score is 0 He has a family hx of CAD - on fathers side     Past Medical History:  Diagnosis Date   Depression    Dyslipidemia    Hemorrhoids     Past Surgical History:  Procedure Laterality Date   HERNIA REPAIR      Current Medications: Current Meds  Medication Sig   aspirin EC 81 MG tablet Take 1 tablet (81 mg total) by mouth daily.   ezetimibe (ZETIA) 10 MG tablet Take 1 tablet (10 mg total) by mouth daily.   famotidine (PEPCID) 20 MG tablet Take 20 mg by mouth 2 (two) times daily.   Multiple Vitamins-Minerals (MULTIVITAMIN ADULT EXTRA C PO) Take 1 tablet by mouth daily.   Omega-3 Fatty Acids (FISH OIL) 1200 MG CAPS Take 1 capsule by mouth 2 (two) times daily.   rosuvastatin (CRESTOR) 40 MG tablet Take 1 tablet (40 mg total) by mouth daily.   [DISCONTINUED] ezetimibe (ZETIA) 10 MG tablet  Take 1 tablet (10 mg total) by mouth daily. Pt needs to keep upcoming appt in Oct for further refills   [DISCONTINUED] rosuvastatin (CRESTOR) 40 MG tablet Take 1 tablet by mouth daily.     Allergies:   Patient has no known allergies.   Social History   Socioeconomic History   Marital status: Married    Spouse name: Not on file   Number of children: Not on file   Years of education: Not on file   Highest education level: Not on file  Occupational History   Not on file  Tobacco Use   Smoking status: Never Smoker   Smokeless tobacco: Never Used  Vaping Use   Vaping Use: Never used  Substance and Sexual Activity   Alcohol use: No   Drug use: No   Sexual activity: Not on file  Other Topics Concern   Not on file  Social History Narrative   Not on file   Social Determinants of Health   Financial Resource Strain:    Difficulty of Paying Living Expenses: Not on file  Food Insecurity:    Worried About Running Out of Food in the Last Year: Not on file   Ran Out of  Food in the Last Year: Not on file  Transportation Needs:    Lack of Transportation (Medical): Not on file   Lack of Transportation (Non-Medical): Not on file  Physical Activity:    Days of Exercise per Week: Not on file   Minutes of Exercise per Session: Not on file  Stress:    Feeling of Stress : Not on file  Social Connections:    Frequency of Communication with Friends and Family: Not on file   Frequency of Social Gatherings with Friends and Family: Not on file   Attends Religious Services: Not on file   Active Member of Clubs or Organizations: Not on file   Attends Banker Meetings: Not on file   Marital Status: Not on file     Family History: The patient's family history includes Heart attack in his father and mother.  ROS:   Please see the history of present illness.     All other systems reviewed and are negative.  EKGs/Labs/Other Studies Reviewed:    The following studies were reviewed today:  EKG:    Oct. 4, 2021:  NSR at 66.  Normal ECg   Recent Labs: No results found for requested labs within last 8760 hours.  Recent Lipid Panel    Component Value Date/Time   CHOL 118 12/04/2018 0742   TRIG 45 12/04/2018 0742   HDL 62 12/04/2018 0742   CHOLHDL 1.9 12/04/2018 0742   LDLCALC 45 12/04/2018 0742    Physical Exam:    Physical Exam: Blood pressure 112/72.  GEN:  Well nourished, well developed in no acute distress HEENT: Normal NECK: No JVD; No carotid bruits LYMPHATICS: No lymphadenopathy CARDIAC: RRR , no murmurs, rubs, gallops RESPIRATORY:  Clear to auscultation without rales, wheezing or rhonchi  ABDOMEN: Soft, non-tender, non-distended MUSCULOSKELETAL:  No edema; No deformity  SKIN: Warm and dry NEUROLOGIC:  Alert and oriented x 3    ASSESSMENT:    1. Mixed hyperlipidemia   2. Family history of early CAD    PLAN:    In order of problems listed above:  1.   Hyperlipidemia:      Coronary calcium score was 0.  He  has a family hx of CAD ( brother had an MI and was  younger than he is )  Will check lipids , liver enz, bmp today  Goal LDL is 70  He remains very active.     Medication Adjustments/Labs and Tests Ordered: Current medicines are reviewed at length with the patient today.  Concerns regarding medicines are outlined above.  Orders Placed This Encounter  Procedures   Lipid Profile   Basic Metabolic Panel (BMET)   Hepatic function panel   EKG 12-Lead   Meds ordered this encounter  Medications   ezetimibe (ZETIA) 10 MG tablet    Sig: Take 1 tablet (10 mg total) by mouth daily.    Dispense:  90 tablet    Refill:  3   rosuvastatin (CRESTOR) 40 MG tablet    Sig: Take 1 tablet (40 mg total) by mouth daily.    Dispense:  90 tablet    Refill:  3     Signed, Kristeen Miss, MD  12/28/2019 9:31 AM    [facility]

## 2019-12-28 ENCOUNTER — Encounter: Payer: Self-pay | Admitting: Cardiovascular Disease

## 2019-12-28 ENCOUNTER — Ambulatory Visit (INDEPENDENT_AMBULATORY_CARE_PROVIDER_SITE_OTHER): Payer: Medicare Other | Admitting: Cardiovascular Disease

## 2019-12-28 ENCOUNTER — Other Ambulatory Visit: Payer: Self-pay

## 2019-12-28 VITALS — BP 112/72

## 2019-12-28 DIAGNOSIS — E782 Mixed hyperlipidemia: Secondary | ICD-10-CM | POA: Diagnosis not present

## 2019-12-28 DIAGNOSIS — Z8249 Family history of ischemic heart disease and other diseases of the circulatory system: Secondary | ICD-10-CM | POA: Diagnosis not present

## 2019-12-28 LAB — HEPATIC FUNCTION PANEL
ALT: 35 IU/L (ref 0–44)
AST: 42 IU/L — ABNORMAL HIGH (ref 0–40)
Albumin: 4.5 g/dL (ref 3.8–4.8)
Alkaline Phosphatase: 72 IU/L (ref 44–121)
Bilirubin Total: 0.7 mg/dL (ref 0.0–1.2)
Bilirubin, Direct: 0.19 mg/dL (ref 0.00–0.40)
Total Protein: 6.8 g/dL (ref 6.0–8.5)

## 2019-12-28 LAB — BASIC METABOLIC PANEL
BUN/Creatinine Ratio: 19 (ref 10–24)
BUN: 20 mg/dL (ref 8–27)
CO2: 26 mmol/L (ref 20–29)
Calcium: 9.4 mg/dL (ref 8.6–10.2)
Chloride: 99 mmol/L (ref 96–106)
Creatinine, Ser: 1.03 mg/dL (ref 0.76–1.27)
GFR calc Af Amer: 85 mL/min/{1.73_m2} (ref >59)
GFR calc non Af Amer: 73 mL/min/{1.73_m2} (ref >59)
Glucose: 102 mg/dL — ABNORMAL HIGH (ref 65–99)
Potassium: 4.7 mmol/L (ref 3.5–5.2)
Sodium: 138 mmol/L (ref 134–144)

## 2019-12-28 LAB — LIPID PANEL
Chol/HDL Ratio: 2 ratio (ref 0.0–5.0)
Cholesterol, Total: 128 mg/dL (ref 100–199)
HDL: 63 mg/dL (ref >39)
LDL Chol Calc (NIH): 55 mg/dL (ref 0–99)
Triglycerides: 42 mg/dL (ref 0–149)
VLDL Cholesterol Cal: 10 mg/dL (ref 5–40)

## 2019-12-28 MED ORDER — EZETIMIBE 10 MG PO TABS: 10.0000 mg | ORAL_TABLET | Freq: Every day | ORAL | 3 refills | Status: DC

## 2019-12-28 MED ORDER — ROSUVASTATIN CALCIUM 40 MG PO TABS: 40.0000 mg | ORAL_TABLET | Freq: Every day | ORAL | 3 refills | Status: AC

## 2019-12-28 NOTE — Patient Instructions (Signed)
Medication Instructions:  Your physician recommends that you continue on your current medications as directed. Please refer to the Current Medication list given to you today.  *If you need a refill on your cardiac medications before your next appointment, please call your pharmacy*   Lab Work: TODAY - cholesterol, liver panel, basic metabolic panel If you have labs (blood work) drawn today and your tests are completely normal, you will receive your results only by: MyChart Message (if you have MyChart) OR A paper copy in the mail If you have any lab test that is abnormal or we need to change your treatment, we will call you to review the results.   Testing/Procedures: None Ordered   Follow-Up: At [facility], you and your health needs are our priority.  As part of our continuing mission to provide you with exceptional heart care, we have created designated Provider Care Teams.  These Care Teams include your primary Cardiologist (physician) and Advanced Practice Providers (APPs -  Physician Assistants and Nurse Practitioners) who all work together to provide you with the care you need, when you need it.  Your next appointment:   1 year(s)  The format for your next appointment:   In Person  Provider:   You may see Philip Nahser, MD or one of the following Advanced Practice Providers on your designated Care Team:   Scott Weaver, PA-C Vin Bhagat, PA-C   

## 2020-12-25 ENCOUNTER — Other Ambulatory Visit: Payer: Self-pay | Admitting: Cardiovascular Disease

## 2020-12-26 ENCOUNTER — Other Ambulatory Visit: Payer: Self-pay

## 2020-12-26 MED ORDER — EZETIMIBE 10 MG PO TABS: 10.0000 mg | ORAL_TABLET | Freq: Every day | ORAL | 0 refills | Status: DC

## 2020-12-26 NOTE — Telephone Encounter (Signed)
Pt's medication was sent to pt's pharmacy as requested. Confirmation received.  °

## 2021-01-23 ENCOUNTER — Encounter: Payer: Self-pay | Admitting: Cardiovascular Disease

## 2021-01-23 NOTE — Progress Notes (Signed)
Cardiology Office Note:    Date:  01/24/2021   ID:  Joshua Garner, DOB [birth year], MRN [id number]  PCP:  Malka So., MD  Cardiologist:  Kristeen Miss, MD   Referring MD: Malka So., MD   Problem List 1. Hyperlipidemia   Chief Complaint  Patient presents with   Hyperlipidemia     June 26, 2017    Joshua Garner is a 70 y.o. male with a hx of hyperlipidemia.  I saw him many years ago at my previous practice-[facility].  Has a strong family hx of CAD His younger brother recently had an MI.  Father had MI at age 19,  Father had a CVA at 25 - died  Mother had a massive at age 15  I saw Joshua Garner many years ago for atypical CP and hyperlipidemia. Has been on Simva and now is on Crestor  Recent labs ( 06/20/17)  Chol = 168 Trig = 73 HDL = 57 LDL = 96  Is eating a grain free diet.  Has not tried Zetia yet.  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   October 11, 2017:  Joshua Garner is seen back today for a history of hyperlipidemia.  He has a family history of coronary artery disease. CT scan reveals a coronary calcium score of 0. He is been on Crestor 4 mg a day.  We added Zetia 10 mg a day. Labs from yesterday revealed an LDL particle number of 635. His LP (a) , ApoB are all normal.     Sept. 15, 2020  [name] is seen  In July  2019 after an absence of many years.  I saw him in the past with a history of hyperlipidemia. His brother who is 4 years younger than he is recently had a major heart attack.  This got Joshua Garner a little bit more worried than usual.  He is been on Crestor 40 mg a day and despite high-dose Crestor his LDL is still 96.    Joshua Garner had a coronary calcium score which was 0.  This places him at very low risk.  We have added Zetia to his medical regimen.  With that, his LDL particle number is 635.  Total cholesterol is 140.  HDL is 59.  Triglyceride level is 94.  On Zetia and crestor 40 mg a day  Lipids levels look great.     Oct. 4, 2021:  Joshua Garner is seen for follow up of his hyperlipidemia Coronary calcium score is 0 He has a family hx of CAD - on fathers side  Nov. 1, 2022: Joshua Garner is seen for follow up of his HLD Coronary calcium score is 0 + family hx of CAD  Labs from his primary MD  His total cholesterol is 130.  LDL is 152.  Triglyceride level is 75.  HDL is 68.  Is active, no real cardio exercise  Still works full time  Work as an Airline pilot     Past Medical History:  Diagnosis Date   Depression    Dyslipidemia    Hemorrhoids     Past Surgical History:  Procedure Laterality Date   HERNIA REPAIR      Current Medications: Current Meds  Medication Sig   aspirin EC 81 MG tablet Take 1 tablet (81 mg total) by mouth daily.   buPROPion (WELLBUTRIN XL) 150 MG 24 hr tablet Take 150 mg by mouth every morning.   busPIRone (BUSPAR) 15 MG tablet Take  1 tablet by mouth daily.   ezetimibe (ZETIA) 10 MG tablet Take 1 tablet (10 mg total) by mouth daily.   famotidine (PEPCID) 20 MG tablet Take 20 mg by mouth 2 (two) times daily.   Multiple Vitamins-Minerals (MULTIVITAMIN ADULT EXTRA C PO) Take 1 tablet by mouth daily.   Omega-3 Fatty Acids (FISH OIL) 1200 MG CAPS Take 1 capsule by mouth 2 (two) times daily.   rosuvastatin (CRESTOR) 40 MG tablet Take 1 tablet (40 mg total) by mouth daily.     Allergies:   Patient has no known allergies.   Social History   Socioeconomic History   Marital status: Married    Spouse name: Not on file   Number of children: Not on file   Years of education: Not on file   Highest education level: Not on file  Occupational History   Not on file  Tobacco Use   Smoking status: Never   Smokeless tobacco: Never  Vaping Use   Vaping Use: Never used  Substance and Sexual Activity   Alcohol use: No   Drug use: No   Sexual activity: Not on file  Other Topics Concern   Not on file  Social History Narrative   Not on file   Social Determinants of Health    Financial Resource Strain: Not on file  Food Insecurity: Not on file  Transportation Needs: Not on file  Physical Activity: Not on file  Stress: Not on file  Social Connections: Not on file     Family History: The patient's family history includes Heart attack in his father and mother.  ROS:   Please see the history of present illness.     All other systems reviewed and are negative.  EKGs/Labs/Other Studies Reviewed:    The following studies were reviewed today:  EKG:    January 24, 2021: Normal sinus rhythm at 62.  No ST or T wave changes.  Recent Labs: No results found for requested labs within last 8760 hours.  Recent Lipid Panel    Component Value Date/Time   CHOL 128 12/28/2019 0829   TRIG 42 12/28/2019 0829   HDL 63 12/28/2019 0829   CHOLHDL 2.0 12/28/2019 0829   LDLCALC 55 12/28/2019 0829    Physical Exam:    Physical Exam: Blood pressure 118/68, pulse 62, height 6' (1.829 m), weight 188 lb 3.2 oz (85.4 kg), SpO2 97 %.  GEN:  Well nourished, well developed in no acute distress HEENT: Normal NECK: No JVD; No carotid bruits LYMPHATICS: No lymphadenopathy CARDIAC: RRR , no murmurs, rubs, gallops RESPIRATORY:  Clear to auscultation without rales, wheezing or rhonchi  ABDOMEN: Soft, non-tender, non-distended MUSCULOSKELETAL:  No edema; No deformity  SKIN: Warm and dry NEUROLOGIC:  Alert and oriented x 3     ASSESSMENT:    1. Mixed hyperlipidemia     PLAN:      1.   Hyperlipidemia:   He brought a copy of his last labs.  All of his labs look excellent.  His LDL cholesterol is 52.  He is on rosuvastatin and Zetia.  At this point [name] continues to do very well.  In fact that not really sure that he needs to come to see a cardiologist every year but will be happy to see him if he needs.  He will need to request that his primary medical doctor pick up his Zetia prescription.  I encouraged him to exercise several times a week.   Coronary calcium  score  was 0.      Medication Adjustments/Labs and Tests Ordered: Current medicines are reviewed at length with the patient today.  Concerns regarding medicines are outlined above.  Orders Placed This Encounter  Procedures   EKG 12-Lead    No orders of the defined types were placed in this encounter.    Signed, Kristeen Miss, MD  01/24/2021 8:51 AM    [facility]

## 2021-01-24 ENCOUNTER — Other Ambulatory Visit: Payer: Self-pay

## 2021-01-24 ENCOUNTER — Encounter: Payer: Self-pay | Admitting: Cardiovascular Disease

## 2021-01-24 ENCOUNTER — Ambulatory Visit (INDEPENDENT_AMBULATORY_CARE_PROVIDER_SITE_OTHER): Payer: Medicare Other | Admitting: Cardiovascular Disease

## 2021-01-24 VITALS — BP 118/68 | HR 62 | Ht 72.0 in | Wt 188.2 lb

## 2021-01-24 DIAGNOSIS — E782 Mixed hyperlipidemia: Secondary | ICD-10-CM

## 2021-01-24 NOTE — Patient Instructions (Signed)
Medication Instructions:  Your physician recommends that you continue on your current medications as directed. Please refer to the Current Medication list given to you today.  *If you need a refill on your cardiac medications before your next appointment, please call your pharmacy*   Lab Work: None If you have labs (blood work) drawn today and your tests are completely normal, you will receive your results only by: . MyChart Message (if you have MyChart) OR . A paper copy in the mail If you have any lab test that is abnormal or we need to change your treatment, we will call you to review the results.   Testing/Procedures: None   Follow-Up: At [facility], you and your health needs are our priority.  As part of our continuing mission to provide you with exceptional heart care, we have created designated Provider Care Teams.  These Care Teams include your primary Cardiologist (physician) and Advanced Practice Providers (APPs -  Physician Assistants and Nurse Practitioners) who all work together to provide you with the care you need, when you need it.  We recommend signing up for the patient portal called "MyChart".  Sign up information is provided on this After Visit Summary.  MyChart is used to connect with patients for Virtual Visits (Telemedicine).  Patients are able to view lab/test results, encounter notes, upcoming appointments, etc.  Non-urgent messages can be sent to your provider as well.   To learn more about what you can do with MyChart, go to [email or web address].com.    Your next appointment:   As needed  The format for your next appointment:   In Person  Provider:   You may see Philip Nahser, MD or one of the following Advanced Practice Providers on your designated Care Team:    Scott Weaver, PA-C  Vin Bhagat, PA-C    Other Instructions   

## 2021-01-28 DIAGNOSIS — K810 Acute cholecystitis: Principal | ICD-10-CM | POA: Insufficient documentation

## 2021-01-28 DIAGNOSIS — Z20822 Contact with and (suspected) exposure to covid-19: Secondary | ICD-10-CM | POA: Insufficient documentation

## 2021-01-28 NOTE — ED Triage Notes (Signed)
Pt POV reports abd pain starting this morning, progressively radiating to chest.  "Feels like gas"  Appx 1 hr PTA nausea without emesis, 9/10 epigastric pain. Pt reports now during triage pain has improved some, pain now 5/10.

## 2021-01-29 ENCOUNTER — Emergency Department (HOSPITAL_BASED_OUTPATIENT_CLINIC_OR_DEPARTMENT_OTHER): Payer: Medicare Other

## 2021-01-29 ENCOUNTER — Emergency Department (HOSPITAL_COMMUNITY): Payer: Medicare Other

## 2021-01-29 ENCOUNTER — Other Ambulatory Visit: Payer: Self-pay

## 2021-01-29 ENCOUNTER — Encounter (HOSPITAL_BASED_OUTPATIENT_CLINIC_OR_DEPARTMENT_OTHER): Payer: Self-pay | Admitting: Radiology

## 2021-01-29 ENCOUNTER — Observation Stay (HOSPITAL_BASED_OUTPATIENT_CLINIC_OR_DEPARTMENT_OTHER)
Admission: EM | Admit: 2021-01-29 | Discharge: 2021-01-30 | Disposition: A | Discharge status: Home / Self Care | Payer: Medicare Other | Attending: General Surgery | Admitting: General Surgery

## 2021-01-29 DIAGNOSIS — K810 Acute cholecystitis: Secondary | ICD-10-CM | POA: Diagnosis present

## 2021-01-29 DIAGNOSIS — Z419 Encounter for procedure for purposes other than remedying health state, unspecified: Secondary | ICD-10-CM

## 2021-01-29 DIAGNOSIS — Z20822 Contact with and (suspected) exposure to covid-19: Secondary | ICD-10-CM | POA: Diagnosis not present

## 2021-01-29 DIAGNOSIS — R1011 Right upper quadrant pain: Secondary | ICD-10-CM

## 2021-01-29 LAB — CBC
HCT: 42.6 % (ref 39.0–52.0)
HCT: 42.6 % (ref 39.0–52.0)
Hemoglobin: 14 g/dL (ref 13.0–17.0)
Hemoglobin: 13.9 g/dL (ref 13.0–17.0)
MCH: 32.2 pg (ref 26.0–34.0)
MCH: 32 pg (ref 26.0–34.0)
MCHC: 32.9 g/dL (ref 30.0–36.0)
MCHC: 32.6 g/dL (ref 30.0–36.0)
MCV: 97.9 fL (ref 80.0–100.0)
MCV: 98.2 fL (ref 80.0–100.0)
Platelets: 212 10*3/uL (ref 150–400)
Platelets: 186 10*3/uL (ref 150–400)
RBC: 4.35 MIL/uL (ref 4.22–5.81)
RBC: 4.34 MIL/uL (ref 4.22–5.81)
RDW: 12.8 % (ref 11.5–15.5)
RDW: 12.8 % (ref 11.5–15.5)
WBC: 10.9 10*3/uL — ABNORMAL HIGH (ref 4.0–10.5)
WBC: 13.2 10*3/uL — ABNORMAL HIGH (ref 4.0–10.5)
nRBC: 0 % (ref 0.0–0.2)
nRBC: 0 % (ref 0.0–0.2)

## 2021-01-29 LAB — COMPREHENSIVE METABOLIC PANEL
ALT: 23 U/L (ref 0–44)
AST: 28 U/L (ref 15–41)
Albumin: 4.6 g/dL (ref 3.5–5.0)
Alkaline Phosphatase: 43 U/L (ref 38–126)
Anion gap: 11 (ref 5–15)
BUN: 15 mg/dL (ref 8–23)
CO2: 32 mmol/L (ref 22–32)
Calcium: 9.3 mg/dL (ref 8.9–10.3)
Chloride: 96 mmol/L — ABNORMAL LOW (ref 98–111)
Creatinine, Ser: 0.89 mg/dL (ref 0.61–1.24)
GFR, Estimated: >60 mL/min (ref >60)
Glucose, Bld: 119 mg/dL — ABNORMAL HIGH (ref 70–99)
Potassium: 3.3 mmol/L — ABNORMAL LOW (ref 3.5–5.1)
Sodium: 139 mmol/L (ref 135–145)
Total Bilirubin: 0.8 mg/dL (ref 0.3–1.2)
Total Protein: 7.6 g/dL (ref 6.5–8.1)

## 2021-01-29 LAB — URINALYSIS, ROUTINE W REFLEX MICROSCOPIC
Bilirubin Urine: NEGATIVE
Glucose, UA: NEGATIVE mg/dL
Hgb urine dipstick: NEGATIVE
Ketones, ur: 15 mg/dL — AB
Leukocytes,Ua: NEGATIVE
Nitrite: NEGATIVE
Specific Gravity, Urine: 1.012 (ref 1.005–1.030)
pH: 8.5 — ABNORMAL HIGH (ref 5.0–8.0)

## 2021-01-29 LAB — RESP PANEL BY RT-PCR (FLU A&B, COVID) ARPGX2
Influenza A by PCR: NEGATIVE
Influenza B by PCR: NEGATIVE
SARS Coronavirus 2 by RT PCR: NEGATIVE

## 2021-01-29 LAB — LIPASE, BLOOD: Lipase: 13 U/L (ref 11–51)

## 2021-01-29 LAB — CREATININE, SERUM
Creatinine, Ser: 0.85 mg/dL (ref 0.61–1.24)
GFR, Estimated: >60 mL/min (ref >60)

## 2021-01-29 LAB — SURGICAL PCR SCREEN
MRSA, PCR: NEGATIVE
Staphylococcus aureus: NEGATIVE

## 2021-01-29 LAB — TROPONIN I (HIGH SENSITIVITY)
Troponin I (High Sensitivity): 5 ng/L (ref <18)
Troponin I (High Sensitivity): 5 ng/L (ref <18)

## 2021-01-29 MED ORDER — ONDANSETRON 4 MG PO TBDP: 4.0000 mg | ORAL_TABLET | Freq: Four times a day (QID) | ORAL | Status: DC | PRN

## 2021-01-29 MED ORDER — ACETAMINOPHEN 650 MG RE SUPP: 650.0000 mg | Freq: Four times a day (QID) | RECTAL | Status: DC | PRN

## 2021-01-29 MED ORDER — OXYCODONE HCL 5 MG PO TABS
5.0000 mg | ORAL_TABLET | ORAL | Status: DC | PRN
  Administered 2021-01-29: 5 mg via ORAL
  Filled 2021-01-29: qty 1

## 2021-01-29 MED ORDER — ONDANSETRON HCL 4 MG/2ML IJ SOLN
4.0000 mg | Freq: Four times a day (QID) | INTRAMUSCULAR | Status: DC | PRN
  Administered 2021-01-29 – 2021-01-30 (×3): 4 mg via INTRAVENOUS
  Filled 2021-01-29 (×3): qty 2

## 2021-01-29 MED ORDER — HYDROMORPHONE HCL 1 MG/ML IJ SOLN
1.0000 mg | INTRAMUSCULAR | Status: DC | PRN
  Administered 2021-01-29 – 2021-01-30 (×4): 1 mg via INTRAVENOUS
  Filled 2021-01-29 (×5): qty 1

## 2021-01-29 MED ORDER — SODIUM CHLORIDE 0.9 % IV SOLN: Freq: Once | INTRAVENOUS | Status: AC

## 2021-01-29 MED ORDER — ONDANSETRON HCL 4 MG/2ML IJ SOLN
INTRAMUSCULAR | Status: AC
  Administered 2021-01-29: 4 mg via INTRAVENOUS
  Filled 2021-01-29: qty 2

## 2021-01-29 MED ORDER — DICYCLOMINE HCL 10 MG/ML IM SOLN
20.0000 mg | Freq: Once | INTRAMUSCULAR | Status: AC
  Administered 2021-01-29: 20 mg via INTRAMUSCULAR
  Filled 2021-01-29: qty 2

## 2021-01-29 MED ORDER — MORPHINE SULFATE (PF) 2 MG/ML IV SOLN
2.0000 mg | INTRAVENOUS | Status: DC | PRN
  Administered 2021-01-29: 2 mg via INTRAVENOUS
  Filled 2021-01-29: qty 1

## 2021-01-29 MED ORDER — IOHEXOL 350 MG/ML SOLN
75.0000 mL | Freq: Once | INTRAVENOUS | Status: AC | PRN
  Administered 2021-01-29: 75 mL via INTRAVENOUS

## 2021-01-29 MED ORDER — FENTANYL CITRATE PF 50 MCG/ML IJ SOSY
PREFILLED_SYRINGE | INTRAMUSCULAR | Status: AC
  Administered 2021-01-29: 50 ug via INTRAVENOUS
  Filled 2021-01-29: qty 1

## 2021-01-29 MED ORDER — MUPIROCIN 2 % EX OINT
1.0000 "application " | TOPICAL_OINTMENT | Freq: Two times a day (BID) | CUTANEOUS | Status: DC
  Administered 2021-01-29: 1 via NASAL
  Filled 2021-01-29: qty 22

## 2021-01-29 MED ORDER — FENTANYL CITRATE PF 50 MCG/ML IJ SOSY
50.0000 ug | PREFILLED_SYRINGE | Freq: Once | INTRAMUSCULAR | Status: AC
  Administered 2021-01-29: 50 ug via INTRAVENOUS
  Filled 2021-01-29: qty 1

## 2021-01-29 MED ORDER — ACETAMINOPHEN 325 MG PO TABS
650.0000 mg | ORAL_TABLET | Freq: Four times a day (QID) | ORAL | Status: DC | PRN
  Filled 2021-01-29: qty 2

## 2021-01-29 MED ORDER — ENOXAPARIN SODIUM 40 MG/0.4ML IJ SOSY
40.0000 mg | PREFILLED_SYRINGE | INTRAMUSCULAR | Status: DC
  Filled 2021-01-29: qty 0.4

## 2021-01-29 MED ORDER — HYDROMORPHONE HCL 1 MG/ML IJ SOLN
1.0000 mg | Freq: Once | INTRAMUSCULAR | Status: AC
  Administered 2021-01-29: 1 mg via INTRAVENOUS
  Filled 2021-01-29: qty 1

## 2021-01-29 MED ORDER — FENTANYL CITRATE PF 50 MCG/ML IJ SOSY: 50.0000 ug | PREFILLED_SYRINGE | Freq: Once | INTRAMUSCULAR | Status: AC

## 2021-01-29 MED ORDER — MORPHINE SULFATE (PF) 4 MG/ML IV SOLN
4.0000 mg | Freq: Once | INTRAVENOUS | Status: AC
  Administered 2021-01-29: 4 mg via INTRAVENOUS
  Filled 2021-01-29: qty 1

## 2021-01-29 MED ORDER — SODIUM CHLORIDE 0.9 % IV SOLN
2.0000 g | INTRAVENOUS | Status: DC
  Administered 2021-01-29 – 2021-01-30 (×2): 2 g via INTRAVENOUS
  Filled 2021-01-29 (×4): qty 20

## 2021-01-29 MED ORDER — METOPROLOL TARTRATE 5 MG/5ML IV SOLN: 5.0000 mg | Freq: Four times a day (QID) | INTRAVENOUS | Status: DC | PRN

## 2021-01-29 MED ORDER — SODIUM CHLORIDE 0.9 % IV SOLN: INTRAVENOUS | Status: DC

## 2021-01-29 MED ORDER — DEXTROSE-NACL 5-0.45 % IV SOLN: INTRAVENOUS | Status: DC

## 2021-01-29 MED ORDER — ONDANSETRON HCL 4 MG/2ML IJ SOLN: 4.0000 mg | Freq: Once | INTRAMUSCULAR | Status: AC

## 2021-01-29 MED ORDER — SODIUM CHLORIDE 0.9 % IV SOLN
1.0000 g | Freq: Once | INTRAVENOUS | Status: AC
  Administered 2021-01-29: 1 g via INTRAVENOUS
  Filled 2021-01-29: qty 10

## 2021-01-29 NOTE — Progress Notes (Signed)
Patient ID: Joshua Garner, male   DOB: 01/25/1951, 70 y.o.   MRN: [id number] Patient notified of surgery scheduled for tomorrow morning due to nursing shortage/staffing issues.  Still having some right upper quadrant pain but medicine has helped.  I discussed the planned procedure, risks, and benefits with him and answered his questions.  He is Adult nurse.  Violeta Gelinas, MD, MPH, FACS Please use [email or web address] to contact on call provider

## 2021-01-29 NOTE — ED Triage Notes (Signed)
Pt presents to the ED from [facility] with [facility] for RUQ abd pain and the need for U/S. Pt complaining of pain in RUQ. Pt denies N/V. Pt last had Dilaudid at 0300 with Rocephin.

## 2021-01-29 NOTE — ED Provider Notes (Signed)
[facility] EMERGENCY DEPARTMENT Provider Note  CSN: [id number] Arrival date & time: 01/28/21 2332    History Chief Complaint  Patient presents with   Abdominal Pain   Chest Pain    Joshua Garner is a 70 y.o. male with no significant PMH reports several hours of diffuse pressure pain in upper abdomen, felt like a gas pain, waxes and wanes but does not resolve. No particular provoking or relieving factors. No fevers. Pain radiates up into his chest but not having chest pain per se. No fever, has had nausea without vomiting, no diarrhea. History of similar he attributed to gas and usually resolves on its own.    Past Medical History:  Diagnosis Date   Depression    Dyslipidemia    Hemorrhoids     Past Surgical History:  Procedure Laterality Date   HERNIA REPAIR      Family History  Problem Relation Age of Onset   Heart attack Mother    Heart attack Father     Social History   Tobacco Use   Smoking status: Never   Smokeless tobacco: Never  Vaping Use   Vaping Use: Never used  Substance Use Topics   Alcohol use: No   Drug use: No     Home Medications Prior to Admission medications   Medication Sig Start Date End Date Taking? Authorizing Provider  aspirin EC 81 MG tablet Take 1 tablet (81 mg total) by mouth daily. 06/26/17   Nahser, Deloris Ping, MD  buPROPion (WELLBUTRIN XL) 150 MG 24 hr tablet Take 150 mg by mouth every morning. 10/27/20   [provider]  busPIRone (BUSPAR) 15 MG tablet Take 1 tablet by mouth daily. 10/16/16   [provider]  ezetimibe (ZETIA) 10 MG tablet Take 1 tablet (10 mg total) by mouth daily. 12/26/20   Nahser, Deloris Ping, MD  famotidine (PEPCID) 20 MG tablet Take 20 mg by mouth 2 (two) times daily. 07/14/19   [provider]  Multiple Vitamins-Minerals (MULTIVITAMIN ADULT EXTRA C PO) Take 1 tablet by mouth daily.    [provider]  nystatin cream (MYCOSTATIN) APPLY TO AFFECTED AREA TWICE A DAY AS  NEEDED Patient not taking: Reported on 01/24/2021 11/09/20   [provider]  Omega-3 Fatty Acids (FISH OIL) 1200 MG CAPS Take 1 capsule by mouth 2 (two) times daily.    [provider]  rosuvastatin (CRESTOR) 40 MG tablet Take 1 tablet (40 mg total) by mouth daily. 12/28/19   Nahser, Deloris Ping, MD     Allergies    Patient has no known allergies.   Review of Systems   Review of Systems A comprehensive review of systems was completed and negative except as noted in HPI.    Physical Exam BP (!) 144/62   Pulse 77   Temp 98.8 F (37.1 C) (Oral)   Resp 20   Ht 6' (1.829 m)   Wt 83.9 kg   SpO2 96%   BMI 25.09 kg/m   Physical Exam Vitals and nursing note reviewed.  Constitutional:      Appearance: Normal appearance.  HENT:     Head: Normocephalic and atraumatic.     Nose: Nose normal.     Mouth/Throat:     Mouth: Mucous membranes are moist.  Eyes:     Extraocular Movements: Extraocular movements intact.     Conjunctiva/sclera: Conjunctivae normal.  Cardiovascular:     Rate and Rhythm: Normal rate.  Pulmonary:     Effort:  Pulmonary effort is normal.     Breath sounds: Normal breath sounds.  Abdominal:     General: Abdomen is flat.     Palpations: Abdomen is soft.     Tenderness: There is abdominal tenderness in the right upper quadrant and epigastric area. There is no guarding. Positive signs include Murphy's sign. Negative signs include McBurney's sign.  Musculoskeletal:        General: No swelling. Normal range of motion.     Cervical back: Neck supple.  Skin:    General: Skin is warm and dry.  Neurological:     General: No focal deficit present.     Mental Status: He is alert.  Psychiatric:        Mood and Affect: Mood normal.     ED Results / Procedures / Treatments   Labs (all labs ordered are listed, but only abnormal results are displayed) Labs Reviewed  COMPREHENSIVE METABOLIC PANEL - Abnormal; Notable for the following components:       Result Value   Potassium 3.3 (*)    Chloride 96 (*)    Glucose, Bld 119 (*)    All other components within normal limits  CBC - Abnormal; Notable for the following components:   WBC 10.9 (*)    All other components within normal limits  URINALYSIS, ROUTINE W REFLEX MICROSCOPIC - Abnormal; Notable for the following components:   Color, Urine COLORLESS (*)    pH 8.5 (*)    Ketones, ur 15 (*)    Protein, ur TRACE (*)    All other components within normal limits  LIPASE, BLOOD  TROPONIN I (HIGH SENSITIVITY)  TROPONIN I (HIGH SENSITIVITY)    EKG EKG Interpretation  Date/Time:  Sunday January 29 2021 00:13:10 EDT Ventricular Rate:  75 PR Interval:  176 QRS Duration: 96 QT Interval:  434 QTC Calculation: 484 R Axis:   68 Text Interpretation: Normal sinus rhythm Prolonged QT Abnormal ECG No significant change was found Confirmed by Susy Frizzle [phone]) on 01/29/2021 12:16:05 AM   Radiology CT Abdomen Pelvis W Contrast  Result Date: 01/29/2021 CLINICAL DATA:  Right upper quadrant pain EXAM: CT ABDOMEN AND PELVIS WITH CONTRAST TECHNIQUE: Multidetector CT imaging of the abdomen and pelvis was performed using the standard protocol following bolus administration of intravenous contrast. CONTRAST:  23mL OMNIPAQUE IOHEXOL 350 MG/ML SOLN COMPARISON:  None. FINDINGS: LOWER CHEST: Normal. HEPATOBILIARY: Normal hepatic contours. No intra- or extrahepatic biliary dilatation. Distended gallbladder with mildly edematous appearance of the gallbladder fundus. No radiopaque cholelithiasis. PANCREAS: Normal pancreas. No ductal dilatation or peripancreatic fluid collection. SPLEEN: Normal. ADRENALS/URINARY TRACT: The adrenal glands are normal. No hydronephrosis, nephroureterolithiasis or solid renal mass. The urinary bladder is normal for degree of distention STOMACH/BOWEL: There is no hiatal hernia. Normal duodenal course and caliber. No small bowel dilatation or inflammation. No focal colonic  abnormality. The appendix is not visualized. No right lower quadrant inflammation or free fluid. VASCULAR/LYMPHATIC: Normal course and caliber of the major abdominal vessels. No abdominal or pelvic lymphadenopathy. REPRODUCTIVE: Normal prostate size with symmetric seminal vesicles. MUSCULOSKELETAL. No bony spinal canal stenosis or focal osseous abnormality. OTHER: None. IMPRESSION: 1. Distended gallbladder with mildly edematous appearance of the gallbladder fundus. No radiopaque cholelithiasis. Right upper quadrant ultrasound may be helpful for further evaluation. 2. Otherwise normal CT of the abdomen and pelvis. Electronically Signed   By: Deatra Robinson M.D.   On: 01/29/2021 02:42    Procedures Procedures  Medications Ordered in the ED Medications  cefTRIAXone (  ROCEPHIN) 1 g in sodium chloride 0.9 % 100 mL IVPB (1 g Intravenous New Bag/Given 01/29/21 0305)  fentaNYL (SUBLIMAZE) injection 50 mcg (50 mcg Intravenous Given 01/29/21 0203)  ondansetron (ZOFRAN) injection 4 mg (4 mg Intravenous Given 01/29/21 0203)  0.9 %  sodium chloride infusion (0 mLs Intravenous Stopped 01/29/21 0221)  iohexol (OMNIPAQUE) 350 MG/ML injection 75 mL (75 mLs Intravenous Contrast Given 01/29/21 0222)  morphine 4 MG/ML injection 4 mg (4 mg Intravenous Given 01/29/21 0220)  HYDROmorphone (DILAUDID) injection 1 mg (1 mg Intravenous Given 01/29/21 0256)     MDM Rules/Calculators/A&P MDM Patient with upper abdomen pain, tender in RUQ. Labs with mild leukocytosis, otherwise normal. Will send for CT as [place] not available at this time. Pain and nausea meds.   ED Course  I have reviewed the triage vital signs and the nursing notes.  Pertinent labs & imaging results that were available during my care of the patient were reviewed by me and considered in my medical decision making (see chart for details).  Clinical Course as of 01/29/21 0331  Wynelle Link Jan 29, 2021  0237 UA is neg.  [CS]  0302 CT images and results reviewed. Concern  for early cholecystitis. Patient has not had pain relief with fentanyl or morphine, will give dilaudid. Given no [place] available here until late tomorrow morning, I spoke with Dr. Sheliah Hatch, General Surgery who agrees with plan to send to the [facility] for [place] and surgery consult. Dr. Adela Lank, EDP accepting. Patient aware of plan.  [CS]    Clinical Course User Index [CS] Pollyann Savoy, MD    Final Clinical Impression(s) / ED Diagnoses Final diagnoses:  RUQ pain    Rx / DC Orders ED Discharge Orders     None        Pollyann Savoy, MD 01/29/21 [phone]

## 2021-01-29 NOTE — ED Provider Notes (Signed)
Seen and examined.  24 hours of cheest and abdominal pain that has now settled in the RUQ.  Seen at [facility] and ruled out for MI.  Ct showed questionable changes in the gall bladder.  Sent here.  I ordered US and it is complete.     NCAT EOMI RRR CTAB NABS, murphy's sign  Results for orders placed or performed during the hospital encounter of 01/29/21  Lipase, blood  Result Value Ref Range   Lipase 13 11 - 51 U/L  Comprehensive metabolic panel  Result Value Ref Range   Sodium 139 135 - 145 mmol/L   Potassium 3.3 (L) 3.5 - 5.1 mmol/L   Chloride 96 (L) 98 - 111 mmol/L   CO2 32 22 - 32 mmol/L   Glucose, Bld 119 (H) 70 - 99 mg/dL   BUN 15 8 - 23 mg/dL   Creatinine, Ser 2.68 0.61 - 1.24 mg/dL   Calcium 9.3 8.9 - 34.1 mg/dL   Total Protein 7.6 6.5 - 8.1 g/dL   Albumin 4.6 3.5 - 5.0 g/dL   AST 28 15 - 41 U/L   ALT 23 0 - 44 U/L   Alkaline Phosphatase 43 38 - 126 U/L   Total Bilirubin 0.8 0.3 - 1.2 mg/dL   GFR, Estimated >96 >22 mL/min   Anion gap 11 5 - 15  CBC  Result Value Ref Range   WBC 10.9 (H) 4.0 - 10.5 K/uL   RBC 4.35 4.22 - 5.81 MIL/uL   Hemoglobin 14.0 13.0 - 17.0 g/dL   HCT 29.7 98.9 - 21.1 %   MCV 97.9 80.0 - 100.0 fL   MCH 32.2 26.0 - 34.0 pg   MCHC 32.9 30.0 - 36.0 g/dL   RDW 94.1 74.0 - 81.4 %   Platelets 212 150 - 400 K/uL   nRBC 0.0 0.0 - 0.2 %  Urinalysis, Routine w reflex microscopic Urine, Clean Catch  Result Value Ref Range   Color, Urine COLORLESS (A) YELLOW   APPearance CLEAR CLEAR   Specific Gravity, Urine 1.012 1.005 - 1.030   pH 8.5 (H) 5.0 - 8.0   Glucose, UA NEGATIVE NEGATIVE mg/dL   Hgb urine dipstick NEGATIVE NEGATIVE   Bilirubin Urine NEGATIVE NEGATIVE   Ketones, ur 15 (A) NEGATIVE mg/dL   Protein, ur TRACE (A) NEGATIVE mg/dL   Nitrite NEGATIVE NEGATIVE   Leukocytes,Ua NEGATIVE NEGATIVE  Troponin I (High Sensitivity)  Result Value Ref Range   Troponin I (High Sensitivity) 5 <18 ng/L  Troponin I (High Sensitivity)  Result Value Ref Range    Troponin I (High Sensitivity) 5 <18 ng/L   CT Abdomen Pelvis W Contrast  Result Date: 01/29/2021 CLINICAL DATA:  Right upper quadrant pain EXAM: CT ABDOMEN AND PELVIS WITH CONTRAST TECHNIQUE: Multidetector CT imaging of the abdomen and pelvis was performed using the standard protocol following bolus administration of intravenous contrast. CONTRAST:  74mL OMNIPAQUE IOHEXOL 350 MG/ML SOLN COMPARISON:  None. FINDINGS: LOWER CHEST: Normal. HEPATOBILIARY: Normal hepatic contours. No intra- or extrahepatic biliary dilatation. Distended gallbladder with mildly edematous appearance of the gallbladder fundus. No radiopaque cholelithiasis. PANCREAS: Normal pancreas. No ductal dilatation or peripancreatic fluid collection. SPLEEN: Normal. ADRENALS/URINARY TRACT: The adrenal glands are normal. No hydronephrosis, nephroureterolithiasis or solid renal mass. The urinary bladder is normal for degree of distention STOMACH/BOWEL: There is no hiatal hernia. Normal duodenal course and caliber. No small bowel dilatation or inflammation. No focal colonic abnormality. The appendix is not visualized. No right lower quadrant inflammation or free fluid.  VASCULAR/LYMPHATIC: Normal course and caliber of the major abdominal vessels. No abdominal or pelvic lymphadenopathy. REPRODUCTIVE: Normal prostate size with symmetric seminal vesicles. MUSCULOSKELETAL. No bony spinal canal stenosis or focal osseous abnormality. OTHER: None. IMPRESSION: 1. Distended gallbladder with mildly edematous appearance of the gallbladder fundus. No radiopaque cholelithiasis. Right upper quadrant ultrasound may be helpful for further evaluation. 2. Otherwise normal CT of the abdomen and pelvis. Electronically Signed   By: Deatra Robinson M.D.   On: 01/29/2021 02:42    Medications  0.9 %  sodium chloride infusion (has no administration in time range)  fentaNYL (SUBLIMAZE) injection 50 mcg (50 mcg Intravenous Given 01/29/21 0203)  ondansetron (ZOFRAN) injection  4 mg (4 mg Intravenous Given 01/29/21 0203)  0.9 %  sodium chloride infusion (0 mLs Intravenous Stopped 01/29/21 0221)  iohexol (OMNIPAQUE) 350 MG/ML injection 75 mL (75 mLs Intravenous Contrast Given 01/29/21 0222)  morphine 4 MG/ML injection 4 mg (4 mg Intravenous Given 01/29/21 0220)  HYDROmorphone (DILAUDID) injection 1 mg (1 mg Intravenous Given 01/29/21 0256)  cefTRIAXone (ROCEPHIN) 1 g in sodium chloride 0.9 % 100 mL IVPB (0 g Intravenous Stopped 01/29/21 0410)  dicyclomine (BENTYL) injection 20 mg (20 mg Intramuscular Given 01/29/21 0456)  fentaNYL (SUBLIMAZE) injection 50 mcg (50 mcg Intravenous Given 01/29/21 0456)    D/w Dr. Sheliah Hatch of surgery who will see the patient   Kalayna Noy, MD 01/29/21 8315

## 2021-01-29 NOTE — H&P (Signed)
**Note Joshua-Identified via Obfuscation** Reason for Consult:abdominal pain Referring Provider: April Palumbo  Joshua Garner is an 70 y.o. male.  HPI: 70 yo male with 1 day of intense abdominal pain. It began yesterday morning at 10 am. It is sharp and in the right upper quadrant. It does not radiate. It is associated with nausea. It is worse with movement. It has had some minor similar pains in the past. It is a 9/10 currently. Pain medication has helped the pain some.  Past Medical History:  Diagnosis Date   Depression    Dyslipidemia    Hemorrhoids     Past Surgical History:  Procedure Laterality Date   HERNIA REPAIR      Family History  Problem Relation Age of Onset   Heart attack Mother    Heart attack Father     Social History:  reports that he has never smoked. He has never used smokeless tobacco. He reports that he does not drink alcohol and does not use drugs.  Allergies: No Known Allergies  Medications: I have reviewed the patient's current medications.  Results for orders placed or performed during the hospital encounter of 01/29/21 (from the past 48 hour(s))  Lipase, blood     Status: None   Collection Time: 01/29/21 12:50 AM  Result Value Ref Range   Lipase 13 11 - 51 U/L    Comment: Performed at [facility], [address], [place], [place] [address]  Comprehensive metabolic panel     Status: Abnormal   Collection Time: 01/29/21 12:50 AM  Result Value Ref Range   Sodium 139 135 - 145 mmol/L   Potassium 3.3 (L) 3.5 - 5.1 mmol/L   Chloride 96 (L) 98 - 111 mmol/L   CO2 32 22 - 32 mmol/L   Glucose, Bld 119 (H) 70 - 99 mg/dL    Comment: Glucose reference range applies only to samples taken after fasting for at least 8 hours.   BUN 15 8 - 23 mg/dL   Creatinine, Ser 4.58 0.61 - 1.24 mg/dL   Calcium 9.3 8.9 - 09.9 mg/dL   Total Protein 7.6 6.5 - 8.1 g/dL   Albumin 4.6 3.5 - 5.0 g/dL   AST 28 15 - 41 U/L   ALT 23 0 - 44 U/L   Alkaline Phosphatase 43 38 - 126 U/L   Total Bilirubin  0.8 0.3 - 1.2 mg/dL   GFR, Estimated >83 >38 mL/min    Comment: (NOTE) Calculated using the CKD-EPI Creatinine Equation (2021)    Anion gap 11 5 - 15    Comment: Performed at [facility], [address], [place], [place] [address]  CBC     Status: Abnormal   Collection Time: 01/29/21 12:50 AM  Result Value Ref Range   WBC 10.9 (H) 4.0 - 10.5 K/uL   RBC 4.35 4.22 - 5.81 MIL/uL   Hemoglobin 14.0 13.0 - 17.0 g/dL   HCT 97.6 73.4 - 19.3 %   MCV 97.9 80.0 - 100.0 fL   MCH 32.2 26.0 - 34.0 pg   MCHC 32.9 30.0 - 36.0 g/dL   RDW 79.0 24.0 - 97.3 %   Platelets 212 150 - 400 K/uL   nRBC 0.0 0.0 - 0.2 %    Comment: Performed at [facility], [address] [place], [place] [address]  Troponin I (High Sensitivity)     Status: None   Collection Time: 01/29/21 12:50 AM  Result Value Ref Range   Troponin I (High Sensitivity) 5 <  18 ng/L    Comment: (NOTE) Elevated high sensitivity troponin I (hsTnI) values and significant  changes across serial measurements may suggest ACS but many other  chronic and acute conditions are known to elevate hsTnI results.  Refer to the "Links" section for chest pain algorithms and additional  guidance. Performed at [facility], [address] [place], [place] [address]   Urinalysis, Routine w reflex microscopic Urine, Clean Catch     Status: Abnormal   Collection Time: 01/29/21  1:00 AM  Result Value Ref Range   Color, Urine COLORLESS (A) YELLOW   APPearance CLEAR CLEAR   Specific Gravity, Urine 1.012 1.005 - 1.030   pH 8.5 (H) 5.0 - 8.0   Glucose, UA NEGATIVE NEGATIVE mg/dL   Hgb urine dipstick NEGATIVE NEGATIVE   Bilirubin Urine NEGATIVE NEGATIVE   Ketones, ur 15 (A) NEGATIVE mg/dL   Protein, ur TRACE (A) NEGATIVE mg/dL   Nitrite NEGATIVE NEGATIVE   Leukocytes,Ua NEGATIVE NEGATIVE    Comment: Performed at [facility], [address] [place], [place] [address]   Troponin I (High Sensitivity)     Status: None   Collection Time: 01/29/21  2:53 AM  Result Value Ref Range   Troponin I (High Sensitivity) 5 <18 ng/L    Comment: (NOTE) Elevated high sensitivity troponin I (hsTnI) values and significant  changes across serial measurements may suggest ACS but many other  chronic and acute conditions are known to elevate hsTnI results.  Refer to the "Links" section for chest pain algorithms and additional  guidance. Performed at [facility], [address] [place], [place] [address]     CT Abdomen Pelvis W Contrast  Result Date: 01/29/2021 CLINICAL DATA:  Right upper quadrant pain EXAM: CT ABDOMEN AND PELVIS WITH CONTRAST TECHNIQUE: Multidetector CT imaging of the abdomen and pelvis was performed using the standard protocol following bolus administration of intravenous contrast. CONTRAST:  69mL OMNIPAQUE IOHEXOL 350 MG/ML SOLN COMPARISON:  None. FINDINGS: LOWER CHEST: Normal. HEPATOBILIARY: Normal hepatic contours. No intra- or extrahepatic biliary dilatation. Distended gallbladder with mildly edematous appearance of the gallbladder fundus. No radiopaque cholelithiasis. PANCREAS: Normal pancreas. No ductal dilatation or peripancreatic fluid collection. SPLEEN: Normal. ADRENALS/URINARY TRACT: The adrenal glands are normal. No hydronephrosis, nephroureterolithiasis or solid renal mass. The urinary bladder is normal for degree of distention STOMACH/BOWEL: There is no hiatal hernia. Normal duodenal course and caliber. No small bowel dilatation or inflammation. No focal colonic abnormality. The appendix is not visualized. No right lower quadrant inflammation or free fluid. VASCULAR/LYMPHATIC: Normal course and caliber of the major abdominal vessels. No abdominal or pelvic lymphadenopathy. REPRODUCTIVE: Normal prostate size with symmetric seminal vesicles. MUSCULOSKELETAL. No bony spinal canal stenosis or focal osseous abnormality. OTHER: None.  IMPRESSION: 1. Distended gallbladder with mildly edematous appearance of the gallbladder fundus. No radiopaque cholelithiasis. Right upper quadrant ultrasound may be helpful for further evaluation. 2. Otherwise normal CT of the abdomen and pelvis. Electronically Signed   By: Deatra Robinson M.D.   On: 01/29/2021 02:42   US Abdomen Limited  Result Date: 01/29/2021 CLINICAL DATA:  Right upper quadrant abdominal pain EXAM: ULTRASOUND ABDOMEN LIMITED RIGHT UPPER QUADRANT COMPARISON:  CT abdomen/pelvis from earlier today FINDINGS: Gallbladder: Moderate diffuse gallbladder wall thickening. Mildly distended gallbladder. No sonographic Murphy sign. No pericholecystic fluid. No shadowing gallstones. Common bile duct: Diameter: 5 mm Liver: No focal lesion identified. Within normal limits in parenchymal echogenicity. Portal vein is patent on color Doppler imaging with normal direction of blood  flow towards the liver. Other: Simple 4.5 cm interpolar right renal cyst. IMPRESSION: 1. Nonspecific moderate diffuse gallbladder wall thickening. No cholelithiasis. No pericholecystic fluid. No sonographic Murphy sign. If there is clinical concern for acalculous cholecystitis, hepatobiliary scintigraphy could be obtained for further evaluation. 2. No biliary ductal dilatation. 3. Normal liver. Electronically Signed   By: Delbert Phenix M.D.   On: 01/29/2021 05:32    Review of Systems  Constitutional: Negative.   HENT: Negative.    Eyes: Negative.   Respiratory: Negative.    Cardiovascular: Negative.   Gastrointestinal:  Positive for abdominal pain and nausea.  Genitourinary: Negative.   Musculoskeletal: Negative.   Skin: Negative.   Neurological: Negative.   Endo/Heme/Allergies: Negative.   Psychiatric/Behavioral: Negative.     PE Blood pressure (!) 132/49, pulse 72, temperature 98.4 F (36.9 C), temperature source Oral, resp. rate 16, height 6' (1.829 m), weight 83.9 kg, SpO2 93 %. Constitutional: NAD; conversant; no  deformities Eyes: Moist conjunctiva; no lid lag; anicteric; PERRL Neck: Trachea midline; no thyromegaly Lungs: Normal respiratory effort; no tactile fremitus CV: RRR; no palpable thrills; no pitting edema GI: Abd tender to palpation RUQ; no palpable hepatosplenomegaly MSK: Normal gait; no clubbing/cyanosis Psychiatric: Appropriate affect; alert and oriented x3 Lymphatic: No palpable cervical or axillary lymphadenopathy Skin: No major subcutaneous nodules. Warm and dry   Assessment/Plan: 70 yo male with acute cholecystitis -IV abx -We discussed the etiology of her pain, we discussed treatment options and recommended surgery. We discussed details of surgery including general anesthesia, laparoscopic approach, identification of cystic duct and common bile duct. Ligation of cystic duct and cystic artery. Possible need for intraoperative cholangiogram or open procedure. Possible risks of common bile duct injury, liver injury, cystic duct leak, bleeding, infection, post-cholecystectomy syndrome. The patient showed good understanding and all questions were answered -observe in med surg  Joshua Garner 01/29/2021, 5:48 AM

## 2021-01-29 NOTE — Anesthesia Preprocedure Evaluation (Addendum)
Anesthesia Evaluation  Patient identified by MRN, date of birth, ID band Patient awake    Reviewed: Allergy & Precautions, NPO status , Patient's Chart, lab work & pertinent test results  History of Anesthesia Complications Negative for: history of anesthetic complications  Airway Mallampati: II  TM Distance: >3 FB Neck ROM: Full    Dental  (+) Teeth Intact, Dental Advisory Given   Pulmonary neg pulmonary ROS,    Pulmonary exam normal        Cardiovascular Normal cardiovascular exam  HLD   Neuro/Psych Depression negative neurological ROS     GI/Hepatic Neg liver ROS, Acute cholecystitis   Endo/Other  negative endocrine ROS  Renal/GU negative Renal ROS  negative genitourinary   Musculoskeletal negative musculoskeletal ROS (+)   Abdominal   Peds  Hematology negative hematology ROS (+)   Anesthesia Other Findings   Reproductive/Obstetrics                            Anesthesia Physical Anesthesia Plan  ASA: 2  Anesthesia Plan: General   Post-op Pain Management:    Induction: Intravenous  PONV Risk Score and Plan: 3 and Ondansetron, Dexamethasone, Treatment may vary due to age or medical condition and Midazolam  Airway Management Planned: Oral ETT  Additional Equipment: None  Intra-op Plan:   Post-operative Plan: Extubation in OR  Informed Consent: I have reviewed the patients History and Physical, chart, labs and discussed the procedure including the risks, benefits and alternatives for the proposed anesthesia with the patient or authorized representative who has indicated his/her understanding and acceptance.     Dental advisory given  Plan Discussed with:   Anesthesia Plan Comments:        Anesthesia Quick Evaluation

## 2021-01-30 ENCOUNTER — Encounter (HOSPITAL_COMMUNITY)
Admission: EM | Disposition: A | Discharge status: Home / Self Care | Payer: Self-pay | Source: Home / Self Care | Attending: Emergency Medicine

## 2021-01-30 ENCOUNTER — Observation Stay (HOSPITAL_COMMUNITY): Payer: Medicare Other

## 2021-01-30 ENCOUNTER — Encounter (HOSPITAL_COMMUNITY): Payer: Self-pay

## 2021-01-30 ENCOUNTER — Observation Stay (HOSPITAL_COMMUNITY): Payer: Medicare Other | Admitting: Anesthesiology

## 2021-01-30 DIAGNOSIS — Z20822 Contact with and (suspected) exposure to covid-19: Secondary | ICD-10-CM | POA: Diagnosis not present

## 2021-01-30 DIAGNOSIS — K810 Acute cholecystitis: Secondary | ICD-10-CM | POA: Diagnosis not present

## 2021-01-30 HISTORY — PX: CHOLECYSTECTOMY: SHX55

## 2021-01-30 LAB — COMPREHENSIVE METABOLIC PANEL
ALT: 24 U/L (ref 0–44)
AST: 33 U/L (ref 15–41)
Albumin: 3.5 g/dL (ref 3.5–5.0)
Alkaline Phosphatase: 37 U/L — ABNORMAL LOW (ref 38–126)
Anion gap: 7 (ref 5–15)
BUN: 9 mg/dL (ref 8–23)
CO2: 28 mmol/L (ref 22–32)
Calcium: 8.4 mg/dL — ABNORMAL LOW (ref 8.9–10.3)
Chloride: 98 mmol/L (ref 98–111)
Creatinine, Ser: 0.76 mg/dL (ref 0.61–1.24)
GFR, Estimated: >60 mL/min (ref >60)
Glucose, Bld: 157 mg/dL — ABNORMAL HIGH (ref 70–99)
Potassium: 3.5 mmol/L (ref 3.5–5.1)
Sodium: 133 mmol/L — ABNORMAL LOW (ref 135–145)
Total Bilirubin: 0.7 mg/dL (ref 0.3–1.2)
Total Protein: 6.1 g/dL — ABNORMAL LOW (ref 6.5–8.1)

## 2021-01-30 LAB — CBC
HCT: 38.5 % — ABNORMAL LOW (ref 39.0–52.0)
Hemoglobin: 12.6 g/dL — ABNORMAL LOW (ref 13.0–17.0)
MCH: 32.1 pg (ref 26.0–34.0)
MCHC: 32.7 g/dL (ref 30.0–36.0)
MCV: 98.2 fL (ref 80.0–100.0)
Platelets: 179 10*3/uL (ref 150–400)
RBC: 3.92 MIL/uL — ABNORMAL LOW (ref 4.22–5.81)
RDW: 13.1 % (ref 11.5–15.5)
WBC: 11.3 10*3/uL — ABNORMAL HIGH (ref 4.0–10.5)
nRBC: 0 % (ref 0.0–0.2)

## 2021-01-30 SURGERY — LAPAROSCOPIC CHOLECYSTECTOMY WITH INTRAOPERATIVE CHOLANGIOGRAM
Anesthesia: General | Site: Abdomen

## 2021-01-30 MED ORDER — AMISULPRIDE (ANTIEMETIC) 5 MG/2ML IV SOLN: 10.0000 mg | Freq: Once | INTRAVENOUS | Status: DC | PRN

## 2021-01-30 MED ORDER — CHLORHEXIDINE GLUCONATE 0.12 % MT SOLN
OROMUCOSAL | Status: AC
  Administered 2021-01-30: 15 mL via OROMUCOSAL
  Filled 2021-01-30: qty 15

## 2021-01-30 MED ORDER — ONDANSETRON HCL 4 MG/2ML IJ SOLN: 4.0000 mg | Freq: Once | INTRAMUSCULAR | Status: DC | PRN

## 2021-01-30 MED ORDER — DEXAMETHASONE SODIUM PHOSPHATE 10 MG/ML IJ SOLN
INTRAMUSCULAR | Status: DC | PRN
  Administered 2021-01-30: 10 mg via INTRAVENOUS

## 2021-01-30 MED ORDER — FENTANYL CITRATE (PF) 250 MCG/5ML IJ SOLN
INTRAMUSCULAR | Status: DC | PRN
  Administered 2021-01-30: 100 ug via INTRAVENOUS

## 2021-01-30 MED ORDER — LORATADINE 10 MG PO TABS
10.0000 mg | ORAL_TABLET | Freq: Every day | ORAL | Status: DC
  Administered 2021-01-30: 10 mg via ORAL
  Filled 2021-01-30: qty 1

## 2021-01-30 MED ORDER — SODIUM CHLORIDE 0.9 % IR SOLN
Status: DC | PRN
  Administered 2021-01-30: 1000 mL

## 2021-01-30 MED ORDER — BUPIVACAINE-EPINEPHRINE (PF) 0.25% -1:200000 IJ SOLN
INTRAMUSCULAR | Status: AC
  Filled 2021-01-30: qty 30

## 2021-01-30 MED ORDER — ORAL CARE MOUTH RINSE: 15.0000 mL | Freq: Once | OROMUCOSAL | Status: AC

## 2021-01-30 MED ORDER — FAMOTIDINE 20 MG PO TABS: 20.0000 mg | ORAL_TABLET | Freq: Every day | ORAL | Status: DC | PRN

## 2021-01-30 MED ORDER — ONDANSETRON HCL 4 MG/2ML IJ SOLN
INTRAMUSCULAR | Status: DC | PRN
  Administered 2021-01-30: 4 mg via INTRAVENOUS

## 2021-01-30 MED ORDER — ROCURONIUM BROMIDE 10 MG/ML (PF) SYRINGE
PREFILLED_SYRINGE | INTRAVENOUS | Status: DC | PRN
Start: 2021-01-30 — End: 2021-01-30
  Administered 2021-01-30: 100 mg via INTRAVENOUS

## 2021-01-30 MED ORDER — PHENYLEPHRINE 40 MCG/ML (10ML) SYRINGE FOR IV PUSH (FOR BLOOD PRESSURE SUPPORT)
PREFILLED_SYRINGE | INTRAVENOUS | Status: DC | PRN
  Administered 2021-01-30: 40 ug via INTRAVENOUS
  Administered 2021-01-30: 80 ug via INTRAVENOUS

## 2021-01-30 MED ORDER — BUSPIRONE HCL 15 MG PO TABS
15.0000 mg | ORAL_TABLET | Freq: Every day | ORAL | Status: DC
  Administered 2021-01-30: 15 mg via ORAL
  Filled 2021-01-30: qty 1

## 2021-01-30 MED ORDER — OXYCODONE HCL 5 MG PO TABS: 5.0000 mg | ORAL_TABLET | Freq: Once | ORAL | Status: DC | PRN

## 2021-01-30 MED ORDER — ROSUVASTATIN CALCIUM 20 MG PO TABS: 40.0000 mg | ORAL_TABLET | Freq: Every day | ORAL | Status: DC

## 2021-01-30 MED ORDER — CHLORHEXIDINE GLUCONATE 0.12 % MT SOLN: 15.0000 mL | Freq: Once | OROMUCOSAL | Status: AC

## 2021-01-30 MED ORDER — OXYCODONE HCL 5 MG/5ML PO SOLN: 5.0000 mg | Freq: Once | ORAL | Status: DC | PRN

## 2021-01-30 MED ORDER — PROPOFOL 10 MG/ML IV BOLUS
INTRAVENOUS | Status: AC
  Filled 2021-01-30: qty 40

## 2021-01-30 MED ORDER — FENTANYL CITRATE (PF) 100 MCG/2ML IJ SOLN: 25.0000 ug | INTRAMUSCULAR | Status: DC | PRN

## 2021-01-30 MED ORDER — EZETIMIBE 10 MG PO TABS: 10.0000 mg | ORAL_TABLET | Freq: Every day | ORAL | Status: DC

## 2021-01-30 MED ORDER — PROPOFOL 10 MG/ML IV BOLUS
INTRAVENOUS | Status: DC | PRN
  Administered 2021-01-30: 140 mg via INTRAVENOUS
  Administered 2021-01-30: 20 mg via INTRAVENOUS

## 2021-01-30 MED ORDER — AMOXICILLIN-POT CLAVULANATE 875-125 MG PO TABS: 1.0000 | ORAL_TABLET | Freq: Two times a day (BID) | ORAL | 0 refills | Status: AC

## 2021-01-30 MED ORDER — SUGAMMADEX SODIUM 200 MG/2ML IV SOLN
INTRAVENOUS | Status: DC | PRN
  Administered 2021-01-30: 400 mg via INTRAVENOUS

## 2021-01-30 MED ORDER — ACETAMINOPHEN 325 MG PO TABS: 650.0000 mg | ORAL_TABLET | Freq: Four times a day (QID) | ORAL | Status: AC | PRN

## 2021-01-30 MED ORDER — BUPIVACAINE-EPINEPHRINE 0.25% -1:200000 IJ SOLN
INTRAMUSCULAR | Status: DC | PRN
  Administered 2021-01-30: 12 mL

## 2021-01-30 MED ORDER — FENTANYL CITRATE (PF) 250 MCG/5ML IJ SOLN
INTRAMUSCULAR | Status: AC
  Filled 2021-01-30: qty 5

## 2021-01-30 MED ORDER — OXYCODONE HCL 5 MG PO TABS: 5.0000 mg | ORAL_TABLET | Freq: Four times a day (QID) | ORAL | 0 refills | Status: AC | PRN

## 2021-01-30 MED ORDER — FAMOTIDINE 20 MG PO TABS
40.0000 mg | ORAL_TABLET | Freq: Every day | ORAL | Status: DC
  Administered 2021-01-30: 40 mg via ORAL
  Filled 2021-01-30: qty 2

## 2021-01-30 MED ORDER — LIDOCAINE 2% (20 MG/ML) 5 ML SYRINGE
INTRAMUSCULAR | Status: DC | PRN
  Administered 2021-01-30: 100 mg via INTRAVENOUS

## 2021-01-30 MED ORDER — LACTATED RINGERS IV SOLN: INTRAVENOUS | Status: DC

## 2021-01-30 SURGICAL SUPPLY — 46 items
ADH SKN CLS APL DERMABOND .7 (GAUZE/BANDAGES/DRESSINGS) ×1
APL PRP STRL LF DISP 70% ISPRP (MISCELLANEOUS) ×1
APPLIER CLIP 5 13 M/L LIGAMAX5 (MISCELLANEOUS) ×2
APR CLP MED LRG 5 ANG JAW (MISCELLANEOUS) ×1
BAG SPEC RTRVL 10 TROC 200 (ENDOMECHANICALS) ×1
BLADE CLIPPER SURG (BLADE) IMPLANT
CANISTER SUCT 3000ML PPV (MISCELLANEOUS) ×2 IMPLANT
CHLORAPREP W/TINT 26 (MISCELLANEOUS) ×2 IMPLANT
CLIP APPLIE 5 13 M/L LIGAMAX5 (MISCELLANEOUS) ×1 IMPLANT
COVER MAYO STAND STRL (DRAPES) ×2 IMPLANT
COVER SURGICAL LIGHT HANDLE (MISCELLANEOUS) ×2 IMPLANT
DERMABOND ADVANCED (GAUZE/BANDAGES/DRESSINGS) ×1
DERMABOND ADVANCED .7 DNX12 (GAUZE/BANDAGES/DRESSINGS) ×1 IMPLANT
DRAPE C-ARM 42X120 X-RAY (DRAPES) ×2 IMPLANT
ELECT REM PT RETURN 9FT ADLT (ELECTROSURGICAL) ×2
ELECTRODE REM PT RTRN 9FT ADLT (ELECTROSURGICAL) ×1 IMPLANT
GLOVE SRG 8 PF TXTR STRL LF DI (GLOVE) ×1 IMPLANT
GLOVE SURG ENC MOIS LTX SZ8 (GLOVE) ×2 IMPLANT
GLOVE SURG UNDER POLY LF SZ8 (GLOVE) ×2
GOWN STRL REUS W/ TWL LRG LVL3 (GOWN DISPOSABLE) ×2 IMPLANT
GOWN STRL REUS W/ TWL XL LVL3 (GOWN DISPOSABLE) ×1 IMPLANT
GOWN STRL REUS W/TWL LRG LVL3 (GOWN DISPOSABLE) ×4
GOWN STRL REUS W/TWL XL LVL3 (GOWN DISPOSABLE) ×2
KIT BASIN OR (CUSTOM PROCEDURE TRAY) ×2 IMPLANT
KIT TURNOVER KIT B (KITS) ×2 IMPLANT
L-HOOK LAP DISP 36CM (ELECTROSURGICAL) ×2
LHOOK LAP DISP 36CM (ELECTROSURGICAL) ×1 IMPLANT
NEEDLE 22X1 1/2 (OR ONLY) (NEEDLE) ×2 IMPLANT
NS IRRIG 1000ML POUR BTL (IV SOLUTION) ×2 IMPLANT
PAD ARMBOARD 7.5X6 YLW CONV (MISCELLANEOUS) ×2 IMPLANT
PENCIL BUTTON HOLSTER BLD 10FT (ELECTRODE) ×2 IMPLANT
POUCH RETRIEVAL ECOSAC 10 (ENDOMECHANICALS) ×1 IMPLANT
POUCH RETRIEVAL ECOSAC 10MM (ENDOMECHANICALS) ×2
SCISSORS LAP 5X35 DISP (ENDOMECHANICALS) ×2 IMPLANT
SET CHOLANGIOGRAPH 5 50 .035 (SET/KITS/TRAYS/PACK) ×2 IMPLANT
SET IRRIG TUBING LAPAROSCOPIC (IRRIGATION / IRRIGATOR) ×2 IMPLANT
SET TUBE SMOKE EVAC HIGH FLOW (TUBING) ×2 IMPLANT
SLEEVE ENDOPATH XCEL 5M (ENDOMECHANICALS) ×4 IMPLANT
SPECIMEN JAR SMALL (MISCELLANEOUS) ×2 IMPLANT
SUT VIC AB 4-0 PS2 27 (SUTURE) ×2 IMPLANT
TOWEL GREEN STERILE (TOWEL DISPOSABLE) ×2 IMPLANT
TOWEL GREEN STERILE FF (TOWEL DISPOSABLE) ×2 IMPLANT
TRAY LAPAROSCOPIC MC (CUSTOM PROCEDURE TRAY) ×2 IMPLANT
TROCAR XCEL BLUNT TIP 100MML (ENDOMECHANICALS) ×2 IMPLANT
TROCAR XCEL NON-BLD 5MMX100MML (ENDOMECHANICALS) ×2 IMPLANT
WATER STERILE IRR 1000ML POUR (IV SOLUTION) ×2 IMPLANT

## 2021-01-30 NOTE — Op Note (Signed)
  01/29/2021 - 01/30/2021  8:52 AM  PATIENT:  Joshua Garner  70 y.o. male  PRE-OPERATIVE DIAGNOSIS: Cholecystitis  POST-OPERATIVE DIAGNOSIS: Gangrenous cholecystitis  PROCEDURE:  Procedure(s): LAPAROSCOPIC CHOLECYSTECTOMY  SURGEON:  Surgeon(s): Violeta Gelinas, MD  ASSISTANTS: Carl Best, PA-C  ANESTHESIA:    general and local  EBL:  No intake/output data recorded.  BLOOD ADMINISTERED:none  DRAINS: none   SPECIMEN:  Excision  DISPOSITION OF SPECIMEN:  PATHOLOGY  COUNTS:  YES  DICTATION: .Dragon Dictation Findings: Gangrenous cholecystitis without perforation  Procedure in detail: Informed consent was obtained.  He was brought to the operating room and general endotracheal anesthesia was administered by the anesthesia staff.  He is currently on IV antibiotics.  I his abdomen was prepped and draped in a sterile fashion.  Timeout procedure was performed.The infraumbilical region was infiltrated with local. Infraumbilical incision was made. Subcutaneous tissues were dissected down revealing the anterior fascia. This was divided sharply along the midline. Peritoneal cavity was entered under direct vision without complication. A 0 Vicryl pursestring was placed around the fascial opening. Hassan trocar was inserted into the abdomen. The abdomen was insufflated with carbon dioxide in standard fashion. Under direct vision a 5 mm epigastric and 5 mm right abdominal port x2 were placed.  Local was used at each port site.  Laparoscopic exploration revealed a gangrenous gallbladder without perforation.  The dome was retracted superior medially.  The infundibulum was retracted inferior laterally.  Dissection began laterally and progressed medially identifying the cystic duct first.  Dissection continued until we had a critical view of safety.  3 clips were placed proximally on the cystic duct and one was placed distally and it was divided.  Further dissection revealed the cystic artery.  This was  clipped twice proximally, once distally and divided.  The gallbladder was taken off the liver bed using cautery.  The liver bed was cauterized to get excellent hemostasis.  The gallbladder was placed in a bag and removed from the abdomen.  It was sent to pathology.  The liver bed was copiously irrigated.  Hemostasis was excellent.  The clips remain in good position.  Ports were removed under direct vision.  Pneumoperitoneum was released.  The infraumbilical fascia was closed by tying the pursestring.  I then placed an additional figure-of-eight 0 Vicryl under direct vision to reinforce the closure.  All 4 wounds were irrigated and the skin of each was closed with 4-0 Vicryl followed by Dermabond.  All counts were correct.  There were no apparent complications.  He was taken to recovery room in stable condition.  PATIENT DISPOSITION:  PACU - hemodynamically stable.   Delay start of Pharmacological VTE agent (>24hrs) due to surgical blood loss or risk of bleeding:  no  Violeta Gelinas, MD, MPH, FACS Pager: [phone]  11/7/20228:52 AM

## 2021-01-30 NOTE — Discharge Summary (Signed)
[facility] Discharge Summary   Patient ID: Joshua Garner MRN: [id number] DOB/AGE: June 30, 1950 70 y.o.  Admit date: 01/29/2021 Discharge date: 01/30/2021  Admitting Diagnosis: Acute cholecystitis  Discharge Diagnosis Gangrenous cholecystitis  Consultants None  Imaging: CT Abdomen Pelvis W Contrast  Result Date: 01/29/2021 CLINICAL DATA:  Right upper quadrant pain EXAM: CT ABDOMEN AND PELVIS WITH CONTRAST TECHNIQUE: Multidetector CT imaging of the abdomen and pelvis was performed using the standard protocol following bolus administration of intravenous contrast. CONTRAST:  25mL OMNIPAQUE IOHEXOL 350 MG/ML SOLN COMPARISON:  None. FINDINGS: LOWER CHEST: Normal. HEPATOBILIARY: Normal hepatic contours. No intra- or extrahepatic biliary dilatation. Distended gallbladder with mildly edematous appearance of the gallbladder fundus. No radiopaque cholelithiasis. PANCREAS: Normal pancreas. No ductal dilatation or peripancreatic fluid collection. SPLEEN: Normal. ADRENALS/URINARY TRACT: The adrenal glands are normal. No hydronephrosis, nephroureterolithiasis or solid renal mass. The urinary bladder is normal for degree of distention STOMACH/BOWEL: There is no hiatal hernia. Normal duodenal course and caliber. No small bowel dilatation or inflammation. No focal colonic abnormality. The appendix is not visualized. No right lower quadrant inflammation or free fluid. VASCULAR/LYMPHATIC: Normal course and caliber of the major abdominal vessels. No abdominal or pelvic lymphadenopathy. REPRODUCTIVE: Normal prostate size with symmetric seminal vesicles. MUSCULOSKELETAL. No bony spinal canal stenosis or focal osseous abnormality. OTHER: None. IMPRESSION: 1. Distended gallbladder with mildly edematous appearance of the gallbladder fundus. No radiopaque cholelithiasis. Right upper quadrant ultrasound may be helpful for further evaluation. 2. Otherwise normal CT of the abdomen and pelvis. Electronically Signed    By: Deatra Robinson M.D.   On: 01/29/2021 02:42   US Abdomen Limited  Result Date: 01/29/2021 CLINICAL DATA:  Right upper quadrant abdominal pain EXAM: ULTRASOUND ABDOMEN LIMITED RIGHT UPPER QUADRANT COMPARISON:  CT abdomen/pelvis from earlier today FINDINGS: Gallbladder: Moderate diffuse gallbladder wall thickening. Mildly distended gallbladder. No sonographic Murphy sign. No pericholecystic fluid. No shadowing gallstones. Common bile duct: Diameter: 5 mm Liver: No focal lesion identified. Within normal limits in parenchymal echogenicity. Portal vein is patent on color Doppler imaging with normal direction of blood flow towards the liver. Other: Simple 4.5 cm interpolar right renal cyst. IMPRESSION: 1. Nonspecific moderate diffuse gallbladder wall thickening. No cholelithiasis. No pericholecystic fluid. No sonographic Murphy sign. If there is clinical concern for acalculous cholecystitis, hepatobiliary scintigraphy could be obtained for further evaluation. 2. No biliary ductal dilatation. 3. Normal liver. Electronically Signed   By: Delbert Phenix M.D.   On: 01/29/2021 05:32    Procedures Dr. Janee Morn (01/30/2021) - Laparoscopic Cholecystectomy  Hospital Course:  Joshua Garner is a 70yo male who presented to [facility] 11/6 with worsening abdominal pain.  Workup showed acute cholecystitis.  Patient was admitted and started on IV antibiotics. He was taken to the OR 01/30/21 for laparoscopic cholecystectomy. Intraoperatively he was found to have gangrenous cholecystitis. Tolerated procedure well and was transferred to the floor.  He was given another dose of IV antibiotics postoperatively. On POD0 the patient was voiding well, tolerating diet, ambulating well, pain well controlled, vital signs stable, incisions c/d/i and felt stable for discharge home.  He will go home with 5 days of augmentin. Patient will follow up as below and knows to call with questions or concerns.    I have personally reviewed the patients  medication history on the [place] controlled substance database.   Physical Exam: General:  Alert, NAD, pleasant, comfortable Pulm: rate and effort normal Abd:  Soft, ND, appropriately tender, multiple lap incisions C/D/I - mild ecchymosis noted around periumbilical  incision without erythema or drainage  Allergies as of 01/30/2021   No Known Allergies      Medication List     TAKE these medications    acetaminophen 325 MG tablet Commonly known as: TYLENOL Take 2 tablets (650 mg total) by mouth every 6 (six) hours as needed for mild pain (or temp > 100).   amoxicillin-clavulanate 875-125 MG tablet Commonly known as: Augmentin Take 1 tablet by mouth 2 (two) times daily for 5 days.   aspirin EC 81 MG tablet Take 1 tablet (81 mg total) by mouth daily.   buPROPion 150 MG 24 hr tablet Commonly known as: WELLBUTRIN XL Take 150 mg by mouth See admin instructions. Take 150mg  on Tuesday, Thursday, Saturday, and Sunday   busPIRone 15 MG tablet Commonly known as: BUSPAR Take 15 mg by mouth daily.   cetirizine 10 MG tablet Commonly known as: ZYRTEC Take 10 mg by mouth daily.   ezetimibe 10 MG tablet Commonly known as: ZETIA Take 1 tablet (10 mg total) by mouth daily.   famotidine 20 MG tablet Commonly known as: PEPCID Take 20 mg by mouth 2 (two) times daily.   Fish Oil 1200 MG Caps Take 1 capsule by mouth 2 (two) times daily.   MULTIVITAMIN ADULT EXTRA C PO Take 1 tablet by mouth daily.   oxyCODONE 5 MG immediate release tablet Commonly known as: Oxy IR/ROXICODONE Take 1 tablet (5 mg total) by mouth every 6 (six) hours as needed for severe pain.   rosuvastatin 40 MG tablet Commonly known as: CRESTOR Take 1 tablet (40 mg total) by mouth daily.          Follow-up Information     [facility] Go on 02/21/2021.   Specialty: General Surgery Why: 9:45 AM. Please arrive 30 min prior to appointment time. Bring photo ID and insurance information with  you. Contact information: [address] [place] [phone]                 Signed: [phone], [facility] Surgery 01/30/2021, 3:18 PM Please see Amion for pager number during day hours 7:00am-4:30pm

## 2021-01-30 NOTE — Progress Notes (Signed)
Day of Surgery   Subjective/Chief Complaint: RUQ pain and nausea   Objective: Vital signs in last 24 hours: Temp:  [97.9 F (36.6 C)-99 F (37.2 C)] 99 F (37.2 C) (11/07 0534) Pulse Rate:  [58-84] 68 (11/07 0534) Resp:  [16-17] 17 (11/06 1610) BP: (116-157)/(61-83) 157/83 (11/07 0534) SpO2:  [94 %-97 %] 97 % (11/07 0534) Last BM Date: 01/28/21  Intake/Output from previous day: 11/06 0701 - 11/07 0700 In: 2354.5 [I.V.:2254.5; IV Piggyback:100] Out: 40 [Emesis/NG output:40] Intake/Output this shift: Total I/O In: 2354.5 [I.V.:2254.5; IV Piggyback:100] Out: -   General appearance: alert and cooperative Resp: clear to auscultation bilaterally Cardio: regular rate and rhythm GI: tender RUQ Extremities: calves soft  Lab Results:  Recent Labs    01/29/21 0552 01/30/21 0046  WBC 13.2* 11.3*  HGB 13.9 12.6*  HCT 42.6 38.5*  PLT 186 179   BMET Recent Labs    01/29/21 0050 01/29/21 0552 01/30/21 0046  NA 139  --  133*  K 3.3*  --  3.5  CL 96*  --  98  CO2 32  --  28  GLUCOSE 119*  --  157*  BUN 15  --  9  CREATININE 0.89 0.85 0.76  CALCIUM 9.3  --  8.4*   PT/INR No results for input(s): LABPROT, INR in the last 72 hours. ABG No results for input(s): PHART, HCO3 in the last 72 hours.  Invalid input(s): PCO2, PO2  Studies/Results: CT Abdomen Pelvis W Contrast  Result Date: 01/29/2021 CLINICAL DATA:  Right upper quadrant pain EXAM: CT ABDOMEN AND PELVIS WITH CONTRAST TECHNIQUE: Multidetector CT imaging of the abdomen and pelvis was performed using the standard protocol following bolus administration of intravenous contrast. CONTRAST:  25mL OMNIPAQUE IOHEXOL 350 MG/ML SOLN COMPARISON:  None. FINDINGS: LOWER CHEST: Normal. HEPATOBILIARY: Normal hepatic contours. No intra- or extrahepatic biliary dilatation. Distended gallbladder with mildly edematous appearance of the gallbladder fundus. No radiopaque cholelithiasis. PANCREAS: Normal pancreas. No ductal dilatation  or peripancreatic fluid collection. SPLEEN: Normal. ADRENALS/URINARY TRACT: The adrenal glands are normal. No hydronephrosis, nephroureterolithiasis or solid renal mass. The urinary bladder is normal for degree of distention STOMACH/BOWEL: There is no hiatal hernia. Normal duodenal course and caliber. No small bowel dilatation or inflammation. No focal colonic abnormality. The appendix is not visualized. No right lower quadrant inflammation or free fluid. VASCULAR/LYMPHATIC: Normal course and caliber of the major abdominal vessels. No abdominal or pelvic lymphadenopathy. REPRODUCTIVE: Normal prostate size with symmetric seminal vesicles. MUSCULOSKELETAL. No bony spinal canal stenosis or focal osseous abnormality. OTHER: None. IMPRESSION: 1. Distended gallbladder with mildly edematous appearance of the gallbladder fundus. No radiopaque cholelithiasis. Right upper quadrant ultrasound may be helpful for further evaluation. 2. Otherwise normal CT of the abdomen and pelvis. Electronically Signed   By: Deatra Robinson M.D.   On: 01/29/2021 02:42   US Abdomen Limited  Result Date: 01/29/2021 CLINICAL DATA:  Right upper quadrant abdominal pain EXAM: ULTRASOUND ABDOMEN LIMITED RIGHT UPPER QUADRANT COMPARISON:  CT abdomen/pelvis from earlier today FINDINGS: Gallbladder: Moderate diffuse gallbladder wall thickening. Mildly distended gallbladder. No sonographic Murphy sign. No pericholecystic fluid. No shadowing gallstones. Common bile duct: Diameter: 5 mm Liver: No focal lesion identified. Within normal limits in parenchymal echogenicity. Portal vein is patent on color Doppler imaging with normal direction of blood flow towards the liver. Other: Simple 4.5 cm interpolar right renal cyst. IMPRESSION: 1. Nonspecific moderate diffuse gallbladder wall thickening. No cholelithiasis. No pericholecystic fluid. No sonographic Murphy sign. If there is clinical concern  for acalculous cholecystitis, hepatobiliary scintigraphy could be  obtained for further evaluation. 2. No biliary ductal dilatation. 3. Normal liver. Electronically Signed   By: Delbert Phenix M.D.   On: 01/29/2021 05:32    Anti-infectives: Anti-infectives (From admission, onward)    Start     Dose/Rate Route Frequency Ordered Stop   01/29/21 2100  [MAR Hold]  cefTRIAXone (ROCEPHIN) 2 g in sodium chloride 0.9 % 100 mL IVPB        (MAR Hold since Mon 01/30/2021 at 0648.Hold Reason: Transfer to a Procedural area)   2 g 200 mL/hr over 30 Minutes Intravenous Every 24 hours 01/29/21 1942     01/29/21 0300  cefTRIAXone (ROCEPHIN) 1 g in sodium chloride 0.9 % 100 mL IVPB        1 g 200 mL/hr over 30 Minutes Intravenous  Once 01/29/21 0259 01/29/21 0410       Assessment/Plan: Cholecystitis - for lap chole, IOC today. Procedure, risks, and benefits again discussed and he agrees. Rocephin IV.  LOS: 0 days    Liz Malady 01/30/2021

## 2021-01-30 NOTE — Discharge Instructions (Signed)
[facility] LAPAROSCOPIC SURGERY: POST OP INSTRUCTIONS Always review your discharge instruction sheet given to you by the facility where your surgery was performed. IF YOU HAVE DISABILITY OR FAMILY LEAVE FORMS, YOU MUST BRING THEM TO THE OFFICE FOR PROCESSING.   DO NOT GIVE THEM TO YOUR DOCTOR.  PAIN CONTROL  First take acetaminophen (Tylenol) AND/or ibuprofen (Advil) to control your pain after surgery.  Follow directions on package.  Taking acetaminophen (Tylenol) and/or ibuprofen (Advil) regularly after surgery will help to control your pain and lower the amount of prescription pain medication you may need.  You should not take more than 3,000 mg (3 grams) of acetaminophen (Tylenol) in 24 hours.  You should not take ibuprofen (Advil), aleve, motrin, naprosyn or other NSAIDS if you have a history of stomach ulcers or chronic kidney disease.  A prescription for pain medication may be given to you upon discharge.  Take your pain medication as prescribed, if you still have uncontrolled pain after taking acetaminophen (Tylenol) or ibuprofen (Advil). Use ice packs to help control pain. If you need a refill on your pain medication, please contact your pharmacy.  They will contact our office to request authorization. Prescriptions will not be filled after 5pm or on week-ends.  HOME MEDICATIONS Take your usually prescribed medications unless otherwise directed.  DIET You should follow a light diet the first few days after arrival home.  Be sure to include lots of fluids daily. Avoid fatty, fried foods.   CONSTIPATION It is common to experience some constipation after surgery and if you are taking pain medication.  Increasing fluid intake and taking a stool softener (such as Colace) will usually help or prevent this problem from occurring.  A mild laxative (Milk of Magnesia or Miralax) should be taken according to package instructions if there are no bowel movements after 48  hours.  WOUND/INCISION CARE Most patients will experience some swelling and bruising in the area of the incisions.  Ice packs will help.  Swelling and bruising can take several days to resolve.  Unless discharge instructions indicate otherwise, follow guidelines below  STERI-STRIPS - you may remove your outer bandages 48 hours after surgery, and you may shower at that time.  You have steri-strips (small skin tapes) in place directly over the incision.  These strips should be left on the skin for 7-10 days.   DERMABOND/SKIN GLUE - you may shower in 24 hours.  The glue will flake off over the next 2-3 weeks. Any sutures or staples will be removed at the office during your follow-up visit.  ACTIVITIES You may resume regular (light) daily activities beginning the next day--such as daily self-care, walking, climbing stairs--gradually increasing activities as tolerated.  You may have sexual intercourse when it is comfortable.  Refrain from any heavy lifting or straining until approved by your doctor. You may drive when you are no longer taking prescription pain medication, you can comfortably wear a seatbelt, and you can safely maneuver your car and apply brakes.  FOLLOW-UP You should see your doctor in the office for a follow-up appointment approximately 2-3 weeks after your surgery.  You should have been given your post-op/follow-up appointment when your surgery was scheduled.  If you did not receive a post-op/follow-up appointment, make sure that you call for this appointment within a day or two after you arrive home to insure a convenient appointment time.   WHEN TO CALL YOUR DOCTOR: Fever over 101.0 Inability to urinate Continued bleeding from incision.   Increased pain, redness, or drainage from the incision. Increasing abdominal pain  The clinic staff is available to answer your questions during regular business hours.  Please don't hesitate to call and ask to speak to one of the nurses for  clinical concerns.  If you have a medical emergency, go to the nearest emergency room or call 911.  A surgeon from [facility] is always on call at the hospital. [address], [address], [place], [place]  [address] ? [address] [address], [place], [place]   [address] [phone] ? [phone] ? FAX [phone] Web site: [email or web address]      Managing Your Pain After Surgery Without Opioids    Thank you for participating in our program to help patients manage their pain after surgery without opioids. This is part of our effort to provide you with the best care possible, without exposing you or your family to the risk that opioids pose.  What pain can I expect after surgery? You can expect to have some pain after surgery. This is normal. The pain is typically worse the day after surgery, and quickly begins to get better. Many studies have found that many patients are able to manage their pain after surgery with Over-the-Counter (OTC) medications such as Tylenol and Motrin. If you have a condition that does not allow you to take Tylenol or Motrin, notify your surgical team.  How will I manage my pain? The best strategy for controlling your pain after surgery is around the clock pain control with Tylenol (acetaminophen) and Motrin (ibuprofen or Advil). Alternating these medications with each other allows you to maximize your pain control. In addition to Tylenol and Motrin, you can use heating pads or ice packs on your incisions to help reduce your pain.  How will I alternate your regular strength over-the-counter pain medication? You will take a dose of pain medication every three hours. Start by taking 650 mg of Tylenol (2 pills of 325 mg) 3 hours later take 600 mg of Motrin (3 pills of 200 mg) 3 hours after taking the Motrin take 650 mg of Tylenol 3 hours after that take 600 mg of Motrin.   - 1 -  See example - if your first dose of Tylenol is at 12:00  PM   12:00 PM Tylenol 650 mg (2 pills of 325 mg)  3:00 PM Motrin 600 mg (3 pills of 200 mg)  6:00 PM Tylenol 650 mg (2 pills of 325 mg)  9:00 PM Motrin 600 mg (3 pills of 200 mg)  Continue alternating every 3 hours   We recommend that you follow this schedule around-the-clock for at least 3 days after surgery, or until you feel that it is no longer needed. Use the table on the last page of this handout to keep track of the medications you are taking. Important: Do not take more than 3000mg of Tylenol or 3200mg of Motrin in a 24-hour period. Do not take ibuprofen/Motrin if you have a history of bleeding stomach ulcers, severe kidney disease, &/or actively taking a blood thinner  What if I still have pain? If you have pain that is not controlled with the over-the-counter pain medications (Tylenol and Motrin or Advil) you might have what we call "breakthrough" pain. You will receive a prescription for a small amount of an opioid pain medication such as Oxycodone, Tramadol, or Tylenol with Codeine. Use these opioid pills in the first 24 hours after surgery if you have breakthrough pain. Do   not take more than 1 pill every 4-6 hours.  If you still have uncontrolled pain after using all opioid pills, don't hesitate to call our staff using the number provided. We will help make sure you are managing your pain in the best way possible, and if necessary, we can provide a prescription for additional pain medication.   Day 1    Time  Name of Medication Number of pills taken  Amount of Acetaminophen  Pain Level   Comments  AM PM       AM PM       AM PM       AM PM       AM PM       AM PM       AM PM       AM PM       Total Daily amount of Acetaminophen Do not take more than  3,000 mg per day      Day 2    Time  Name of Medication Number of pills taken  Amount of Acetaminophen  Pain Level   Comments  AM PM       AM PM       AM PM       AM PM       AM PM       AM PM       AM  PM       AM PM       Total Daily amount of Acetaminophen Do not take more than  3,000 mg per day      Day 3    Time  Name of Medication Number of pills taken  Amount of Acetaminophen  Pain Level   Comments  AM PM       AM PM       AM PM       AM PM          AM PM       AM PM       AM PM       AM PM       Total Daily amount of Acetaminophen Do not take more than  3,000 mg per day      Day 4    Time  Name of Medication Number of pills taken  Amount of Acetaminophen  Pain Level   Comments  AM PM       AM PM       AM PM       AM PM       AM PM       AM PM       AM PM       AM PM       Total Daily amount of Acetaminophen Do not take more than  3,000 mg per day      Day 5    Time  Name of Medication Number of pills taken  Amount of Acetaminophen  Pain Level   Comments  AM PM       AM PM       AM PM       AM PM       AM PM       AM PM       AM PM       AM PM       Total Daily amount of Acetaminophen Do not take more than    3,000 mg per day       Day 6    Time  Name of Medication Number of pills taken  Amount of Acetaminophen  Pain Level  Comments  AM PM       AM PM       AM PM       AM PM       AM PM       AM PM       AM PM       AM PM       Total Daily amount of Acetaminophen Do not take more than  3,000 mg per day      Day 7    Time  Name of Medication Number of pills taken  Amount of Acetaminophen  Pain Level   Comments  AM PM       AM PM       AM PM       AM PM       AM PM       AM PM       AM PM       AM PM       Total Daily amount of Acetaminophen Do not take more than  3,000 mg per day        For additional information about how and where to safely dispose of unused opioid medications - https://www.[employer]powerful[employer].org  Disclaimer: This document contains information and/or instructional materials adapted from [facility] for the typical patient with your condition. It does not replace medical advice  from your health care provider because your experience may differ from that of the typical patient. Talk to your health care provider if you have any questions about this document, your condition or your treatment plan. Adapted from [facility]  

## 2021-01-30 NOTE — Transfer of Care (Signed)
Immediate Anesthesia Transfer of Care Note  Patient: Joshua Garner  Procedure(s) Performed: LAPAROSCOPIC CHOLECYSTECTOMY (Abdomen)  Patient Location: PACU  Anesthesia Type:General  Level of Consciousness: awake, alert , oriented and patient cooperative  Airway & Oxygen Therapy: Patient Spontanous Breathing and Patient connected to face mask oxygen  Post-op Assessment: Report given to RN, Post -op Vital signs reviewed and stable and Patient moving all extremities  Post vital signs: Reviewed and stable  Last Vitals:  Vitals Value Taken Time  BP 127/65 01/30/21 0911  Temp    Pulse 78 01/30/21 0913  Resp 24 01/30/21 0913  SpO2 99 % 01/30/21 0913  Vitals shown include unvalidated device data.  Last Pain:  Vitals:   01/30/21 0534  TempSrc: Oral  PainSc:          Complications: No notable events documented.

## 2021-01-30 NOTE — Progress Notes (Signed)
Patient given discharge instructions and stated understanding. 

## 2021-01-30 NOTE — Anesthesia Procedure Notes (Signed)
Procedure Name: Intubation Date/Time: 01/30/2021 8:07 AM Performed by: Myna Bright, CRNA Pre-anesthesia Checklist: Patient identified, Emergency Drugs available, Suction available and Patient being monitored Patient Re-evaluated:Patient Re-evaluated prior to induction Oxygen Delivery Method: Circle system utilized Preoxygenation: Pre-oxygenation with 100% oxygen Induction Type: IV induction Ventilation: Mask ventilation without difficulty Laryngoscope Size: Mac and 4 Grade View: Grade II Tube type: Oral Tube size: 7.5 mm Number of attempts: 1 Airway Equipment and Method: Stylet Placement Confirmation: ETT inserted through vocal cords under direct vision, positive ETCO2 and breath sounds checked- equal and bilateral Secured at: 22 cm Tube secured with: Tape Dental Injury: Teeth and Oropharynx as per pre-operative assessment

## 2021-01-30 NOTE — Anesthesia Postprocedure Evaluation (Signed)
Anesthesia Post Note  Patient: Joshua Garner  Procedure(s) Performed: LAPAROSCOPIC CHOLECYSTECTOMY (Abdomen)     Patient location during evaluation: PACU Anesthesia Type: General Level of consciousness: awake and alert Pain management: pain level controlled Vital Signs Assessment: post-procedure vital signs reviewed and stable Respiratory status: spontaneous breathing, nonlabored ventilation and respiratory function stable Cardiovascular status: blood pressure returned to baseline and stable Postop Assessment: no apparent nausea or vomiting Anesthetic complications: no   No notable events documented.  Last Vitals:  Vitals:   01/30/21 0927 01/30/21 0942  BP: 130/69 129/67  Pulse: 78   Resp: 19 12  Temp:    SpO2: 93% 95%    Last Pain:  Vitals:   01/30/21 1100  TempSrc:   PainSc: 2                  Lucretia Kern

## 2021-01-30 NOTE — Plan of Care (Signed)
  Problem: Education: Goal: Knowledge of General Education information will improve Description: Including pain rating scale, medication(s)/side effects and non-pharmacologic comfort measures 01/30/2021 1634 by Coy Saunas, RN Outcome: Adequate for Discharge 01/30/2021 1634 by Coy Saunas, RN Outcome: Adequate for Discharge   Problem: Health Behavior/Discharge Planning: Goal: Ability to manage health-related needs will improve 01/30/2021 1634 by Coy Saunas, RN Outcome: Adequate for Discharge 01/30/2021 1634 by Coy Saunas, RN Outcome: Adequate for Discharge   Problem: Clinical Measurements: Goal: Ability to maintain clinical measurements within normal limits will improve 01/30/2021 1634 by Coy Saunas, RN Outcome: Adequate for Discharge 01/30/2021 1634 by Coy Saunas, RN Outcome: Adequate for Discharge Goal: Will remain free from infection 01/30/2021 1634 by Coy Saunas, RN Outcome: Adequate for Discharge 01/30/2021 1634 by Coy Saunas, RN Outcome: Adequate for Discharge Goal: Diagnostic test results will improve 01/30/2021 1634 by Coy Saunas, RN Outcome: Adequate for Discharge 01/30/2021 1634 by Coy Saunas, RN Outcome: Adequate for Discharge Goal: Respiratory complications will improve 01/30/2021 1634 by Coy Saunas, RN Outcome: Adequate for Discharge 01/30/2021 1634 by Coy Saunas, RN Outcome: Adequate for Discharge Goal: Cardiovascular complication will be avoided 01/30/2021 1634 by Coy Saunas, RN Outcome: Adequate for Discharge 01/30/2021 1634 by Coy Saunas, RN Outcome: Adequate for Discharge   Problem: Activity: Goal: Risk for activity intolerance will decrease 01/30/2021 1634 by Coy Saunas, RN Outcome: Adequate for Discharge 01/30/2021 1634 by Coy Saunas, RN Outcome: Adequate for Discharge   Problem: Nutrition: Goal: Adequate nutrition will be maintained 01/30/2021 1634 by Coy Saunas, RN Outcome: Adequate for  Discharge 01/30/2021 1634 by Coy Saunas, RN Outcome: Adequate for Discharge   Problem: Coping: Goal: Level of anxiety will decrease 01/30/2021 1634 by Coy Saunas, RN Outcome: Adequate for Discharge 01/30/2021 1634 by Coy Saunas, RN Outcome: Adequate for Discharge   Problem: Elimination: Goal: Will not experience complications related to bowel motility 01/30/2021 1634 by Coy Saunas, RN Outcome: Adequate for Discharge 01/30/2021 1634 by Coy Saunas, RN Outcome: Adequate for Discharge Goal: Will not experience complications related to urinary retention 01/30/2021 1634 by Coy Saunas, RN Outcome: Adequate for Discharge 01/30/2021 1634 by Coy Saunas, RN Outcome: Adequate for Discharge   Problem: Pain Managment: Goal: General experience of comfort will improve 01/30/2021 1634 by Coy Saunas, RN Outcome: Adequate for Discharge 01/30/2021 1634 by Coy Saunas, RN Outcome: Adequate for Discharge   Problem: Safety: Goal: Ability to remain free from injury will improve 01/30/2021 1634 by Coy Saunas, RN Outcome: Adequate for Discharge 01/30/2021 1634 by Coy Saunas, RN Outcome: Adequate for Discharge   Problem: Skin Integrity: Goal: Risk for impaired skin integrity will decrease 01/30/2021 1634 by Coy Saunas, RN Outcome: Adequate for Discharge 01/30/2021 1634 by Coy Saunas, RN Outcome: Adequate for Discharge

## 2021-01-30 NOTE — Progress Notes (Signed)
Pt transported to OR

## 2021-01-31 ENCOUNTER — Encounter (HOSPITAL_COMMUNITY): Payer: Self-pay | Admitting: General Surgery

## 2021-01-31 LAB — SURGICAL PATHOLOGY

## 2021-03-22 ENCOUNTER — Other Ambulatory Visit: Payer: Self-pay | Admitting: Cardiovascular Disease

## 2022-04-09 IMAGING — US US ABDOMEN LIMITED
1 series · 14 of 25 positions shown · non-contrast
Comparison: CT abdomen/pelvis from earlier today

CLINICAL DATA: Right upper quadrant abdominal pain

EXAM:
ULTRASOUND ABDOMEN LIMITED RIGHT UPPER QUADRANT

[Series 1: us abdomen limited · 14 of 37 slices shown]
[im 1/37]
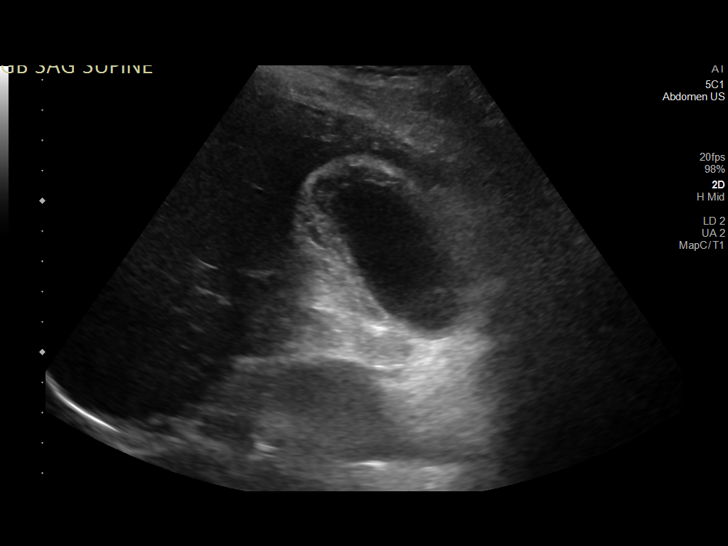
[im 4/37]
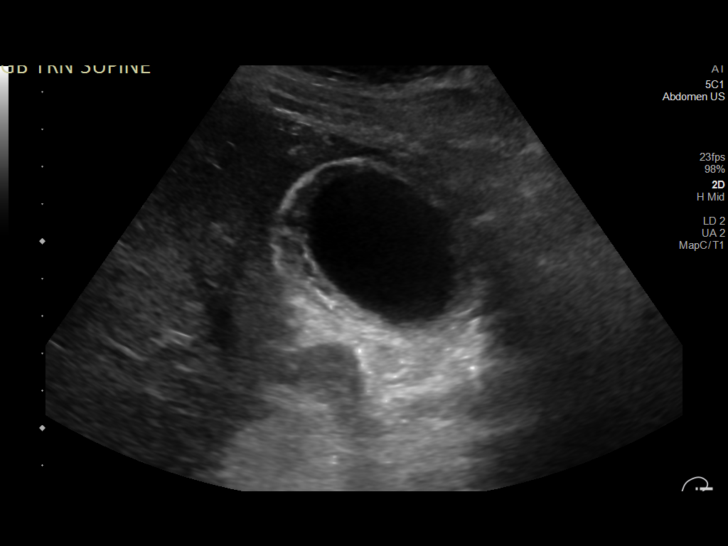
[im 7/37]
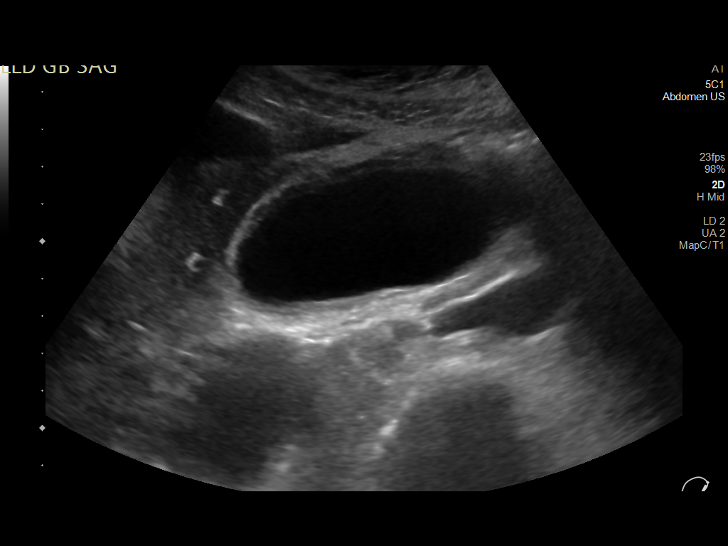
[im 10/37]
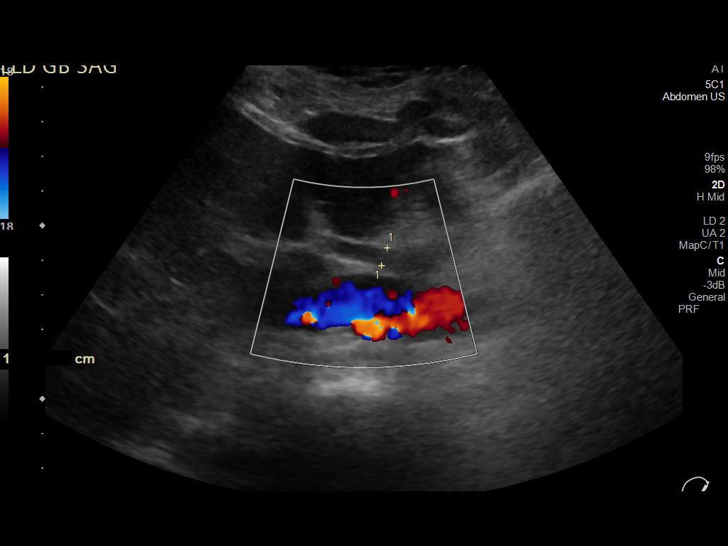
[im 13/37]
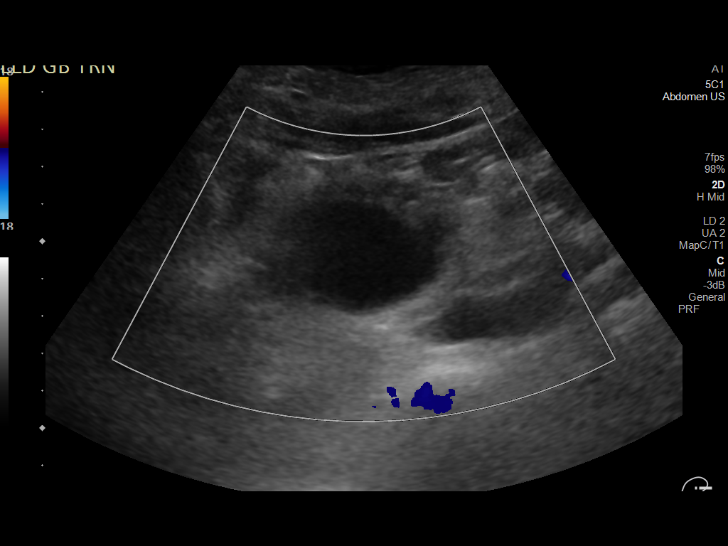
[im 14/37]
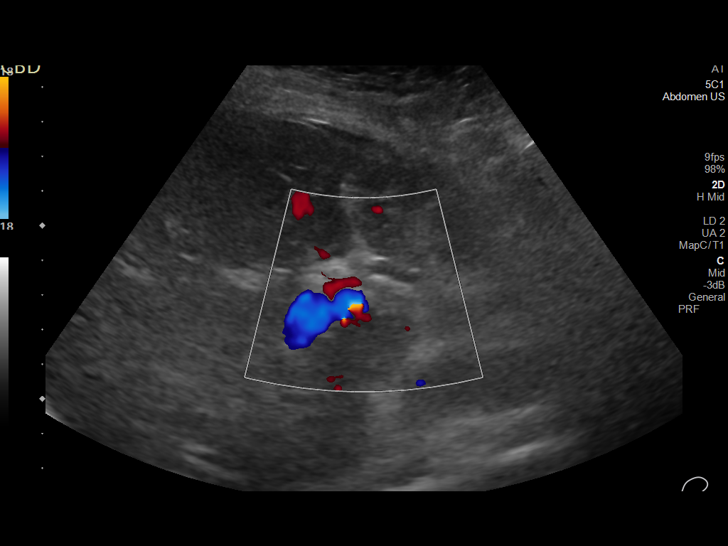
[im 17/37]
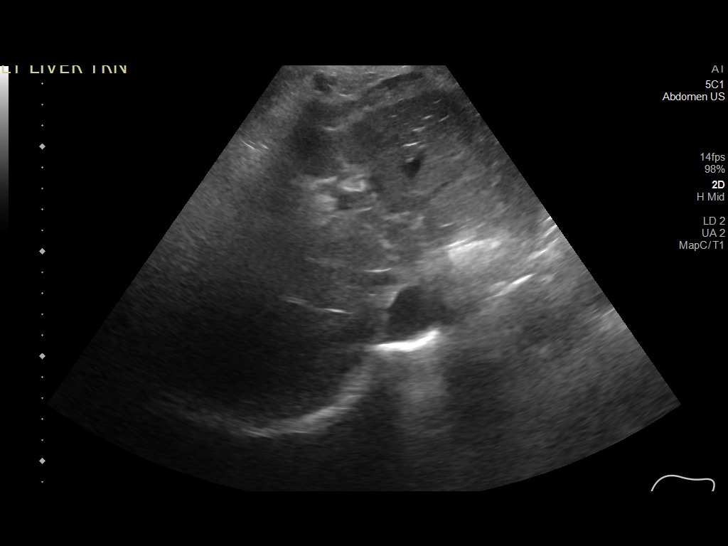
[im 20/37]
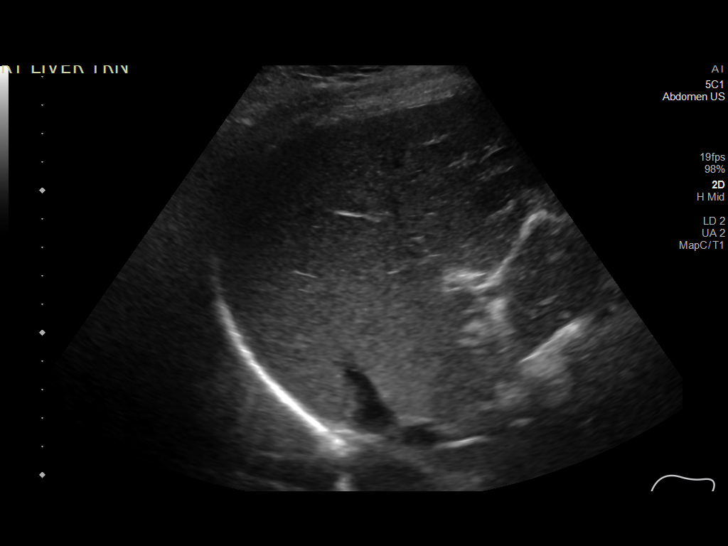
[im 23/37]
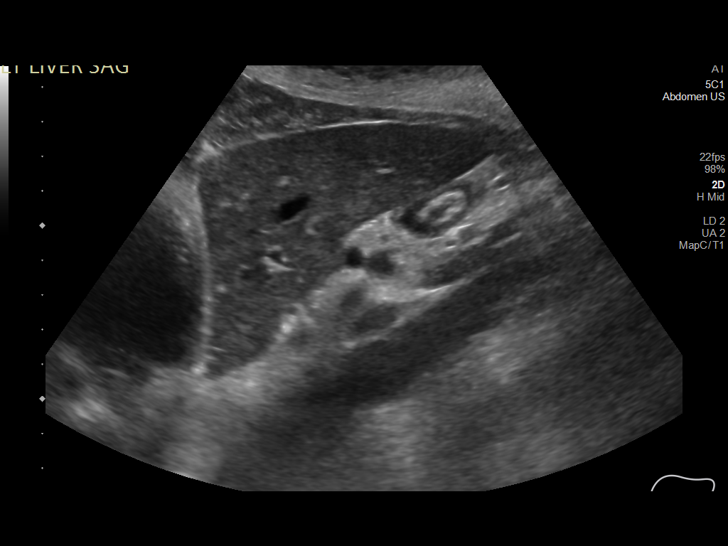
[im 25/37]
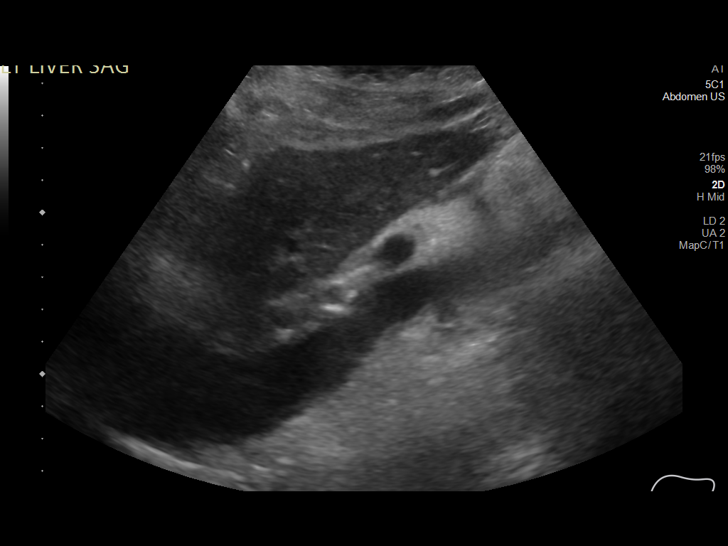
[im 28/37]
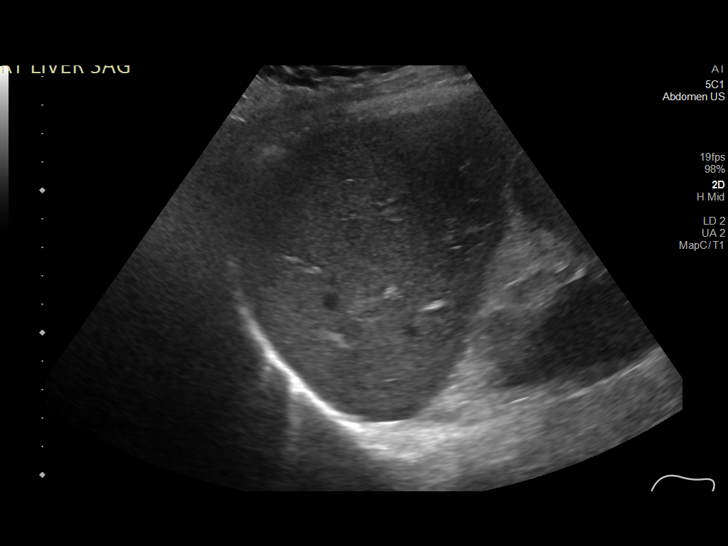
[im 31/37]
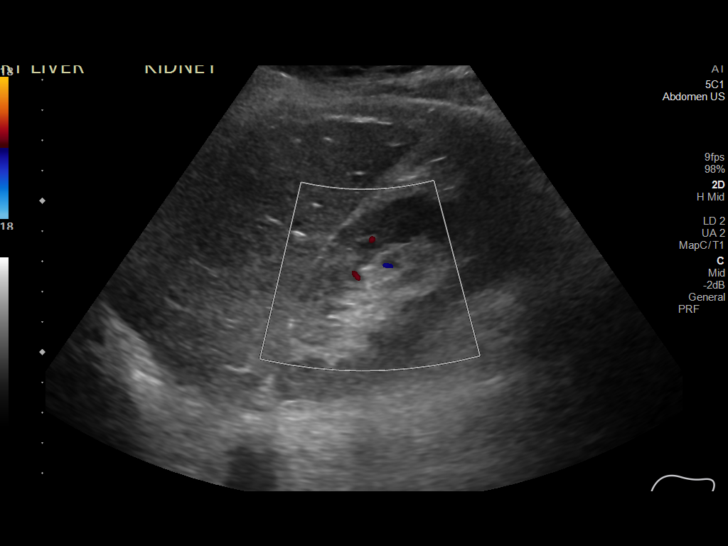
[im 34/37]
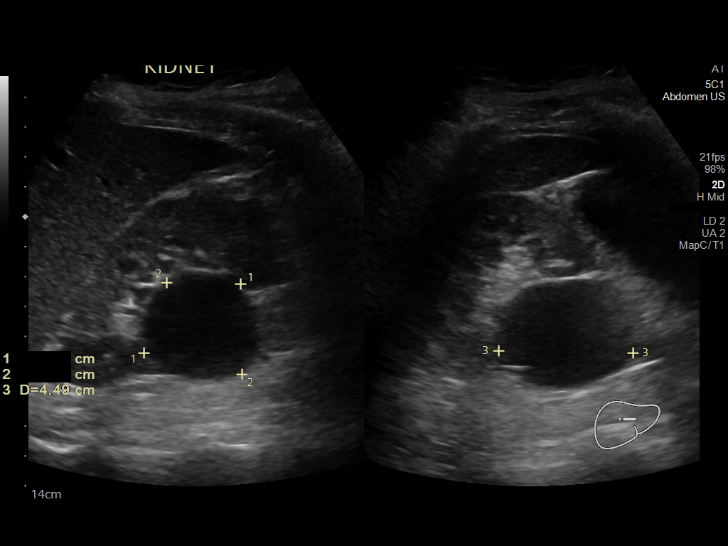
[im 37/37]
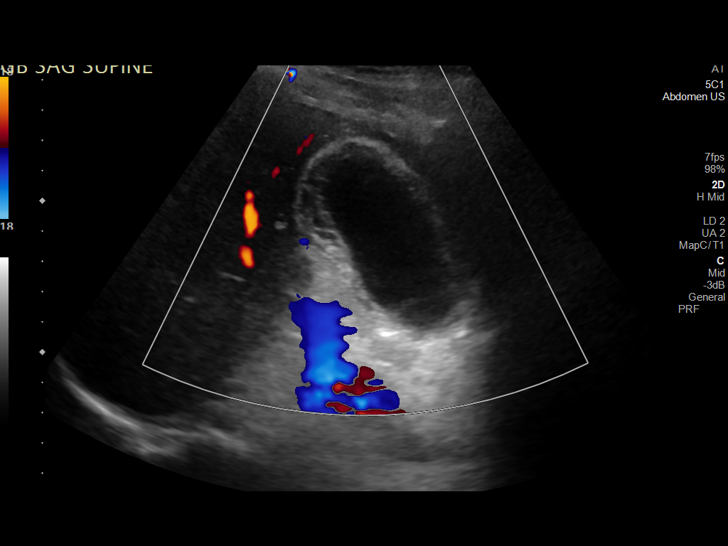

[14 of 25 positions shown; findings below may reference images not displayed]

FINDINGS: Gallbladder:

Moderate diffuse gallbladder wall thickening. Mildly distended
gallbladder. No sonographic Murphy sign. No pericholecystic fluid.
No shadowing gallstones.

Common bile duct:

Diameter: 5 mm

Liver:

No focal lesion identified. Within normal limits in parenchymal
echogenicity. Portal vein is patent on color Doppler imaging with
normal direction of blood flow towards the liver.

Other: Simple 4.5 cm interpolar right renal cyst.
IMPRESSION: 1. Nonspecific moderate diffuse gallbladder wall thickening. No
cholelithiasis. No pericholecystic fluid. No sonographic Murphy
sign. If there is clinical concern for acalculous cholecystitis,
hepatobiliary scintigraphy could be obtained for further evaluation.
2. No biliary ductal dilatation.
3. Normal liver.
